# Patient Record
Sex: Male | Born: 1975 | Race: Black or African American | Hispanic: Refuse to answer | Marital: Married | Smoking: Current some day smoker
Health system: Southern US, Community
[De-identification: ages and names within clinical notes are randomized; demographics above are authoritative.]

## PROBLEM LIST (undated history)

## (undated) DIAGNOSIS — T7840XA Allergy, unspecified, initial encounter: Secondary | ICD-10-CM

## (undated) DIAGNOSIS — K219 Gastro-esophageal reflux disease without esophagitis: Secondary | ICD-10-CM

## (undated) HISTORY — DX: Gastro-esophageal reflux disease without esophagitis: K21.9

## (undated) HISTORY — DX: Allergy, unspecified, initial encounter: T78.40XA

## (undated) HISTORY — PX: WISDOM TOOTH EXTRACTION: SHX21

---

## 1997-12-29 ENCOUNTER — Emergency Department (HOSPITAL_COMMUNITY): Admission: EM | Admit: 1997-12-29 | Discharge: 1997-12-29 | Payer: Self-pay | Admitting: Emergency Medicine

## 1997-12-30 ENCOUNTER — Encounter: Payer: Self-pay | Admitting: Emergency Medicine

## 1999-05-23 ENCOUNTER — Encounter: Payer: Self-pay | Admitting: Emergency Medicine

## 1999-05-23 ENCOUNTER — Emergency Department (HOSPITAL_COMMUNITY): Admission: EM | Admit: 1999-05-23 | Discharge: 1999-05-23 | Payer: Self-pay | Admitting: *Deleted

## 2001-01-21 ENCOUNTER — Emergency Department (HOSPITAL_COMMUNITY): Admission: EM | Admit: 2001-01-21 | Discharge: 2001-01-21 | Payer: Self-pay | Admitting: Emergency Medicine

## 2001-01-21 ENCOUNTER — Encounter: Payer: Self-pay | Admitting: Emergency Medicine

## 2008-05-01 ENCOUNTER — Emergency Department (HOSPITAL_COMMUNITY): Admission: EM | Admit: 2008-05-01 | Discharge: 2008-05-01 | Payer: Self-pay | Admitting: Emergency Medicine

## 2010-04-19 LAB — COMPREHENSIVE METABOLIC PANEL
CO2: 33 mEq/L — ABNORMAL HIGH (ref 19–32)
Calcium: 9.8 mg/dL (ref 8.4–10.5)
Creatinine, Ser: 0.86 mg/dL (ref 0.4–1.5)
GFR calc Af Amer: >60 mL/min (ref >60)
GFR calc non Af Amer: >60 mL/min (ref >60)
Glucose, Bld: 113 mg/dL — ABNORMAL HIGH (ref 70–99)
Total Protein: 6.9 g/dL (ref 6.0–8.3)

## 2010-04-19 LAB — CBC
HCT: 46.1 % (ref 39.0–52.0)
Hemoglobin: 15.4 g/dL (ref 13.0–17.0)
MCHC: 33.4 g/dL (ref 30.0–36.0)
MCV: 83.7 fL (ref 78.0–100.0)
Platelets: 312 10*3/uL (ref 150–400)
RBC: 5.51 MIL/uL (ref 4.22–5.81)
RDW: 13.6 % (ref 11.5–15.5)
WBC: 7.4 10*3/uL (ref 4.0–10.5)

## 2010-04-19 LAB — DIFFERENTIAL
Lymphocytes Relative: 15 % (ref 12–46)
Lymphs Abs: 1.1 10*3/uL (ref 0.7–4.0)
Neutrophils Relative %: 76 % (ref 43–77)

## 2010-04-19 LAB — URINALYSIS, ROUTINE W REFLEX MICROSCOPIC
Bilirubin Urine: NEGATIVE
Glucose, UA: NEGATIVE mg/dL
Hgb urine dipstick: NEGATIVE
Ketones, ur: NEGATIVE mg/dL
Nitrite: NEGATIVE
Protein, ur: NEGATIVE mg/dL
Specific Gravity, Urine: 1.009 (ref 1.005–1.030)
Urobilinogen, UA: 0.2 mg/dL (ref 0.0–1.0)
pH: 7.5 (ref 5.0–8.0)

## 2010-04-19 LAB — LIPASE, BLOOD: Lipase: 15 U/L (ref 11–59)

## 2010-05-26 NOTE — Consult Note (Signed)
[facility]  Patient:    Douglas Klein, Douglas Klein Visit Number: [id number] MRN: [id number]          Service Type: EMS Location: [facility] Attending Physician:  Osvaldo Human Dictated by:   Marlan Palau, M.D. Proc. Date: 01/21/01 Admit Date:  01/21/2001 Discharge Date: 01/21/2001   CC:         [facility] [address]                          Consultation Report  HISTORY OF PRESENT ILLNESS:  The patient is a 35 year old, right-handed, black gentleman born 1975/07/06, with a history of intermittent dizzy episodes. The patient had a recurring dizzy event that occurred around 5:30 a.m. today lasting about 2 hours with resolution. The patient describes a light-headed, floaty feeling, not true vertigo. The patient did note some visual blurring and some slight tendency to go to one side or the other with walking but did not black out. The patient initially did not have a headache but as the dizziness cleared, had a mild bifrontal headache. The patient has had similar dizzy episodes in the past, usually not as severe as the one today. The patient was here in 1999 with a similar episode; claims to have minor dizzy episodes once every 2 weeks or so. Headaches may also occur with some regularity. The patient denies any photophobia, phonophobia, nausea, or vomiting with the headaches. 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 It is not clear whether this may be a bit larger than the prior scan done in 1999. Neurology was asked to see this patient for further evaluation. The patient at this point is feeling back to his baseline.  PAST MEDICAL HISTORY:  Significant for: 1. History of dizziness as above. 2. Rathkes pouch cyst as above. 3. History of headache, rule out migraine.  PAST SURGICAL HISTORY:  The patient states he has not had  any surgery.  CURRENT MEDICATIONS:  The patient is on no medications.  SOCIAL HABITS:  The patient does not smoke or drink; denies use of drugs.  ALLERGIES:  No known drug allergies.  SOCIAL HISTORY:  The patient lives in the [place] area; is a sorter for [employer], and the patient is married and has 2 children who are alive and well.  FAMILY MEDICAL HISTORY:  Notable that mother is alive with hypertension, some history of headache as well; father is alive and well. The patient has 1 brother and 1 sister, both alive and well.  REVIEW OF SYSTEMS:  Notable for fact that the patient has no fevers, chills, does note some slight neck stiffness, denies shortness of breath, chest pains, blackout episodes, nausea, vomiting, bowel or bladder control problems.  PHYSICAL EXAMINATION:  VITAL SIGNS:  Blood pressure 120/77, heart rate 67, respiratory rate 20, temperature afebrile.  GENERAL:  This patient is a well-developed black gentleman. He is alert and cooperative at the time of examination.  HEENT:  Head is atraumatic. Eyes:  Pupils are equal, round, and reactive to light. Discs are flat bilaterally.  NECK:  Supple. No carotid bruits noted.  RESPIRATORY:  Clear to auscultation and percussion.  CARDIOVASCULAR:  Reveals regular rate and rhythm without obvious murmurs or rubs noted.  EXTREMITIES:  Without significant edema.  NEUROLOGIC:  Cranial  nerves as above. Facial symmetry is present. The patient has good sensation in the face to pinprick and soft touch bilaterally. The patient has good strength in facial muscles and the muscles of the head turning, shoulder shrug bilaterally. The patient again has well-enunciated speech; no aphasia. Motor testing is normal on all fours. The patient has good finger-to-nose, finger-to-heel-to-shin. Gait normal. Tandem gait normal. Romberg negative. No evidence of pronator drift seen. Pinprick, soft touch, vibratory sensation is symmetric and normal  throughout. Again, deep tendon reflexes are symmetric and normal toes downgoing bilaterally.  LABORATORY AND ACCESSORY DATA:  Notable for white count 3.0, hemoglobin 15.1, hematocrit 44.8, MCV 78.6, platelets 311. Sodium 138, potassium 3.9, chloride 105, CO2 28, glucose 94, BUN 10, creatinine 0.9, calcium 9.4, total protein 7.5, albumin 4.1, AST 29, ALT 34, alkaline phosphatase 64, total bilirubin 1.0. Alcohol level negative.  Urinalysis:  Specific gravity 1.011, pH 7.5, otherwise unremarkable. Drug screen negative.  IMPRESSION: 1. History of dizziness, episode today. Etiology unclear. Rule out    associated migraine. 2. History of Rathkes pouch type cyst.  The patient has no brain stem compression from the above cyst. I am not clear this has any bearing on his current symptomatology. It is possible that dizziness may be related to migraine syndrome. The patient does have positive family history for migraine. We will release this patient to return home today and will follow with [facility] within about 3 or 4 weeks. May need to check pituitary access at that point with thyroid profile, growth hormone, Prolixin levels. The patient has apparently seen an endocrinologist in the past. Dictated by:   Marlan Palau, M.D. Attending Physician:  Osvaldo Human DD:  01/21/01 TD:  01/22/01 Job: [phone]

## 2012-06-12 ENCOUNTER — Encounter: Payer: Self-pay | Admitting: Family Medicine

## 2012-06-12 ENCOUNTER — Ambulatory Visit (INDEPENDENT_AMBULATORY_CARE_PROVIDER_SITE_OTHER): Payer: Self-pay | Admitting: Family Medicine

## 2012-06-12 VITALS — BP 120/70 | HR 76 | Temp 98.1°F | Resp 18 | Ht 67.25 in | Wt 173.0 lb

## 2012-06-12 DIAGNOSIS — Z0000 Encounter for general adult medical examination without abnormal findings: Secondary | ICD-10-CM

## 2012-06-12 DIAGNOSIS — Z23 Encounter for immunization: Secondary | ICD-10-CM

## 2012-06-12 LAB — LIPID PANEL
HDL: 52.6 mg/dL (ref >39.00)
LDL Cholesterol: 92 mg/dL (ref 0–99)
Total CHOL/HDL Ratio: 3
Triglycerides: 90 mg/dL (ref 0.0–149.0)
VLDL: 18 mg/dL (ref 0.0–40.0)

## 2012-06-12 LAB — CBC WITH DIFFERENTIAL/PLATELET
Basophils Absolute: 0 10*3/uL (ref 0.0–0.1)
HCT: 47.4 % (ref 39.0–52.0)
Lymphocytes Relative: 25 % (ref 12.0–46.0)
Lymphs Abs: 1.3 10*3/uL (ref 0.7–4.0)
Monocytes Relative: 9.3 % (ref 3.0–12.0)
Platelets: 368 10*3/uL (ref 150.0–400.0)
RDW: 14.6 % (ref 11.5–14.6)

## 2012-06-12 LAB — HEPATIC FUNCTION PANEL
Albumin: 4.4 g/dL (ref 3.5–5.2)
Alkaline Phosphatase: 50 U/L (ref 39–117)

## 2012-06-12 LAB — BASIC METABOLIC PANEL
CO2: 34 mEq/L — ABNORMAL HIGH (ref 19–32)
Chloride: 104 mEq/L (ref 96–112)
Potassium: 4.3 mEq/L (ref 3.5–5.1)
Sodium: 141 mEq/L (ref 135–145)

## 2012-06-12 LAB — TSH: TSH: 0.61 u[IU]/mL (ref 0.35–5.50)

## 2012-06-12 NOTE — Progress Notes (Signed)
  Subjective:    Patient ID: Douglas Klein, male    DOB: Aug 09, 1975, 37 y.o.   MRN: [id number]  HPI Patient here to establish care and for wellness visit No chronic medical problems. No prior surgeries. Takes no medications. Patient does not exercise consistently but job requires significant physical activity Patient nonsmoker. No alcohol use. Last tetanus unknown  Family history significant for father with alcohol history. Both parents with hypertension. Couple grandparents with history of CAD  Patient is married and has 3 children. 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   Review of Systems  Constitutional: Negative for fever, activity change, appetite change, fatigue and unexpected weight change.  HENT: Negative for ear pain, congestion and trouble swallowing.   Eyes: Negative for pain and visual disturbance.  Respiratory: Negative for cough, shortness of breath and wheezing.   Cardiovascular: Negative for chest pain and palpitations.  Gastrointestinal: Negative for nausea, vomiting, abdominal pain, diarrhea, constipation, blood in stool, abdominal distention and rectal pain.  Genitourinary: Negative for dysuria, hematuria and testicular pain.  Musculoskeletal: Negative for joint swelling and arthralgias.  Skin: Negative for rash.  Neurological: Negative for dizziness, syncope and headaches.  Hematological: Negative for adenopathy.  Psychiatric/Behavioral: Negative for confusion and dysphoric mood.       Objective:   Physical Exam  Constitutional: He is oriented to person, place, and time. He appears well-developed and well-nourished. No distress.  HENT:  Head: Normocephalic and atraumatic.  Right Ear: External ear normal.  Left Ear: External ear normal.  Mouth/Throat: Oropharynx is clear and moist.  Eyes: Conjunctivae and EOM are normal. Pupils are equal, round, and reactive to light.  Neck: Normal range of motion. Neck supple. No thyromegaly present.   Cardiovascular: Normal rate, regular rhythm and normal heart sounds.   No murmur heard. Pulmonary/Chest: No respiratory distress. He has no wheezes. He has no rales.  Abdominal: Soft. Bowel sounds are normal. He exhibits no distension and no mass. There is no tenderness. There is no rebound and no guarding.  Musculoskeletal: He exhibits no edema.  Lymphadenopathy:    He has no cervical adenopathy.  Neurological: He is alert and oriented to person, place, and time. He displays normal reflexes. No cranial nerve deficit.  Skin: No rash noted.  Psychiatric: He has a normal mood and affect.          Assessment & Plan:  Healthy 37 year old male. Obtain baseline labs. Tetanus booster given.

## 2012-06-12 NOTE — Patient Instructions (Addendum)
Try to establish regular exercise habits.

## 2013-05-07 ENCOUNTER — Ambulatory Visit (INDEPENDENT_AMBULATORY_CARE_PROVIDER_SITE_OTHER): Payer: BC Managed Care – PPO | Admitting: Family Medicine

## 2013-05-07 ENCOUNTER — Encounter: Payer: Self-pay | Admitting: Family Medicine

## 2013-05-07 VITALS — BP 122/78 | HR 84 | Temp 97.9°F | Wt 178.0 lb

## 2013-05-07 DIAGNOSIS — J329 Chronic sinusitis, unspecified: Secondary | ICD-10-CM

## 2013-05-07 MED ORDER — AMOXICILLIN 875 MG PO TABS: 875.0000 mg | ORAL_TABLET | Freq: Two times a day (BID) | ORAL | Status: DC

## 2013-05-07 NOTE — Progress Notes (Signed)
   Subjective:    Patient ID: Douglas Klein, male    DOB: 07-Jul-1975, 38 y.o.   MRN: [id number]  Sinusitis Associated symptoms include congestion, coughing, headaches and sinus pressure. Pertinent negatives include no chills or sore throat.   Acute visit. Patient seen with over one week history of progressive sinus symptoms. He has allergy symptoms generally this time year but does develop some headaches frontal sinus pressure burning sensation in the sinuses. No fevers or chills. Taking Zyrtec-D without much relief. Denies any sore throat. He describes thick yellow to green nasal discharge intermittently. Increased malaise. Dry cough.  No past medical history on file. No past surgical history on file.  reports that he has never smoked. He has never used smokeless tobacco. He reports that he drinks alcohol. He reports that he does not use illicit drugs. family history is not on file. No Known Allergies    Review of Systems  Constitutional: Positive for fatigue. Negative for fever and chills.  HENT: Positive for congestion and sinus pressure. Negative for sore throat.   Respiratory: Positive for cough.   Neurological: Positive for headaches.       Objective:   Physical Exam  Constitutional: He appears well-developed and well-nourished.  HENT:  Right Ear: External ear normal.  Left Ear: External ear normal.  Mouth/Throat: Oropharynx is clear and moist.  Neck: Neck supple.  Cardiovascular: Normal rate and regular rhythm.   Pulmonary/Chest: Effort normal and breath sounds normal. No respiratory distress. He has no wheezes.  Lymphadenopathy:    He has no cervical adenopathy.          Assessment & Plan:  Acute sinusitis. Suspect he also has some seasonal allergies. Add Nasacort to his Zyrtec. Given progression of symptoms we'll start amoxicillin 875 mg twice a day for 10 days

## 2013-05-07 NOTE — Patient Instructions (Signed)

## 2013-05-07 NOTE — Progress Notes (Signed)
Pre visit review using our clinic review tool, if applicable. No additional management support is needed unless otherwise documented below in the visit note. 

## 2013-11-16 ENCOUNTER — Emergency Department (HOSPITAL_COMMUNITY)
Admission: EM | Admit: 2013-11-16 | Discharge: 2013-11-16 | Disposition: A | Discharge status: Home / Self Care | Payer: BC Managed Care – PPO | Attending: Emergency Medicine | Admitting: Emergency Medicine

## 2013-11-16 ENCOUNTER — Encounter (HOSPITAL_COMMUNITY): Payer: Self-pay | Admitting: Emergency Medicine

## 2013-11-16 ENCOUNTER — Emergency Department (HOSPITAL_COMMUNITY): Payer: BC Managed Care – PPO

## 2013-11-16 DIAGNOSIS — R42 Dizziness and giddiness: Secondary | ICD-10-CM | POA: Diagnosis present

## 2013-11-16 DIAGNOSIS — Z792 Long term (current) use of antibiotics: Secondary | ICD-10-CM | POA: Diagnosis not present

## 2013-11-16 DIAGNOSIS — Z79899 Other long term (current) drug therapy: Secondary | ICD-10-CM | POA: Diagnosis not present

## 2013-11-16 DIAGNOSIS — R519 Headache, unspecified: Secondary | ICD-10-CM

## 2013-11-16 DIAGNOSIS — H8111 Benign paroxysmal vertigo, right ear: Secondary | ICD-10-CM

## 2013-11-16 DIAGNOSIS — G43909 Migraine, unspecified, not intractable, without status migrainosus: Secondary | ICD-10-CM | POA: Insufficient documentation

## 2013-11-16 DIAGNOSIS — R51 Headache: Secondary | ICD-10-CM

## 2013-11-16 LAB — CBC
HCT: 44.2 % (ref 39.0–52.0)
Hemoglobin: 15 g/dL (ref 13.0–17.0)
MCH: 27.5 pg (ref 26.0–34.0)
MCHC: 33.9 g/dL (ref 30.0–36.0)
MCV: 81 fL (ref 78.0–100.0)
PLATELETS: 278 10*3/uL (ref 150–400)
RBC: 5.46 MIL/uL (ref 4.22–5.81)
RDW: 13.1 % (ref 11.5–15.5)
WBC: 8 10*3/uL (ref 4.0–10.5)

## 2013-11-16 LAB — URINALYSIS, ROUTINE W REFLEX MICROSCOPIC
BILIRUBIN URINE: NEGATIVE
Glucose, UA: NEGATIVE mg/dL
Hgb urine dipstick: NEGATIVE
KETONES UR: NEGATIVE mg/dL
LEUKOCYTES UA: NEGATIVE
NITRITE: NEGATIVE
PROTEIN: NEGATIVE mg/dL
Specific Gravity, Urine: 1.018 (ref 1.005–1.030)
UROBILINOGEN UA: 1 mg/dL (ref 0.0–1.0)
pH: 7 (ref 5.0–8.0)

## 2013-11-16 LAB — BASIC METABOLIC PANEL
ANION GAP: 11 (ref 5–15)
BUN: 16 mg/dL (ref 6–23)
CALCIUM: 10 mg/dL (ref 8.4–10.5)
CO2: 29 mEq/L (ref 19–32)
CREATININE: 1.08 mg/dL (ref 0.50–1.35)
Chloride: 99 mEq/L (ref 96–112)
GFR calc non Af Amer: 85 mL/min — ABNORMAL LOW (ref >90)
Glucose, Bld: 152 mg/dL — ABNORMAL HIGH (ref 70–99)
Potassium: 4.2 mEq/L (ref 3.7–5.3)
SODIUM: 139 meq/L (ref 137–147)

## 2013-11-16 MED ORDER — MECLIZINE HCL 25 MG PO TABS
25.0000 mg | ORAL_TABLET | Freq: Once | ORAL | Status: AC
  Administered 2013-11-16: 25 mg via ORAL
  Filled 2013-11-16: qty 1

## 2013-11-16 MED ORDER — MECLIZINE HCL 12.5 MG PO TABS: 12.5000 mg | ORAL_TABLET | Freq: Three times a day (TID) | ORAL | Status: DC | PRN

## 2013-11-16 MED ORDER — ONDANSETRON 4 MG PO TBDP
4.0000 mg | ORAL_TABLET | Freq: Once | ORAL | Status: AC
  Administered 2013-11-16: 4 mg via ORAL
  Filled 2013-11-16: qty 1

## 2013-11-16 NOTE — ED Notes (Signed)
Family at bedside, mother with pt. Pt up to bedside to use urinal at this time. States no other needs

## 2013-11-16 NOTE — ED Notes (Signed)
Per EMS, pt experienced sudden onset of dizziness, bilateral weakness. Pt lethargic since EMS arrived on scene

## 2013-11-16 NOTE — ED Provider Notes (Signed)
The patient is a 38 year old male, no past medical history who presents after experiencing acute onset of vertigo while he was working. He states this came on acutely, improved with several minutes of resting and becomes worsened when moving his head. On exam the patient has reproducible nystatin S and vertigo with head movement which is fatigable and no other focal neurologic findings on his exam which was otherwise totally normal. CT scan of his head was ordered prior to this exam, exam was reassuring for a peripheral source of vertigo, improved significantly with meclizine and Zofran, appear stable for discharge if CT scan normal.  Medical screening examination/treatment/procedure(s) were conducted as a shared visit with non-physician practitioner(s) and myself.  I personally evaluated the patient during the encounter.  Clinical Impression:   Final diagnoses:  Dizziness  Headache  BPPV (benign paroxysmal positional vertigo), right         Vida RollerBrian D Darric Plante, MD 11/17/13 [phone]

## 2013-11-16 NOTE — ED Notes (Signed)
Bed: WA23 Expected date:  Expected time:  Means of arrival:  Comments: EMS-weakness 

## 2013-11-16 NOTE — ED Provider Notes (Signed)
CSN: [id number]     Arrival date & time 11/16/13  1715 History   First MD Initiated Contact with Patient 11/16/13 1802     Chief Complaint  Patient presents with  . Dizziness   Douglas Klein is a 38 y.o. male with a history of vertigo and migraines who presents to the ED after an episode of dizziness while working today. Patient reports he was driving when he felt like it was spinning around him. He pulled over and noted that he felt his vision was out of focus and had a questionable breif syncopal episode. He reports feeling weak and lightheaded and is now complaining of a headache behind his bilateral eyes. He reports this feels different from his previous episode of vertigo. He also reports some frequency of urination. He denies recent illness, fevers, chills, nausea, vomiting, abdominal pain, dysuria, hematuria, sinus congestion, nasal congestion, numbness, tingling, loss of bowel or bladder control. He denies history of seizures.  (Consider location/radiation/quality/duration/timing/severity/associated sxs/prior Treatment) Patient is a 38 y.o. male presenting with dizziness. The history is provided by the patient.  Dizziness Quality:  Head spinning and lightheadedness Severity:  Moderate Onset quality:  Sudden Duration:  3 hours Timing:  Constant Progression:  Worsening Chronicity:  New Associated symptoms: headaches, nausea, vision changes and weakness   Associated symptoms: no blood in stool, no chest pain, no diarrhea, no hearing loss, no palpitations, no shortness of breath, no tinnitus and no vomiting     History reviewed. No pertinent past medical history. History reviewed. No pertinent past surgical history. Family History  Problem Relation Age of Onset  . Hypertension Mother   . Hypertension Father   . Diabetes Other    History  Substance Use Topics  . Smoking status: Never Smoker   . Smokeless tobacco: Never Used  . Alcohol Use: Yes     Comment: "Socially"     Review of Systems  Constitutional: Negative for fever and chills.  HENT: Negative for congestion, ear discharge, ear pain, hearing loss, mouth sores, postnasal drip, rhinorrhea, sinus pressure, sore throat, tinnitus, trouble swallowing and voice change.   Eyes: Negative for pain and visual disturbance.  Respiratory: Negative for cough, shortness of breath and wheezing.   Cardiovascular: Negative for chest pain, palpitations and leg swelling.  Gastrointestinal: Positive for nausea. Negative for vomiting, abdominal pain, diarrhea, constipation and blood in stool.  Genitourinary: Positive for frequency. Negative for dysuria, urgency, hematuria and decreased urine volume.  Musculoskeletal: Negative for myalgias, back pain and neck pain.  Skin: Negative for rash and wound.  Neurological: Positive for dizziness, weakness, light-headedness and headaches. Negative for seizures, facial asymmetry, speech difficulty and numbness.  Psychiatric/Behavioral: Negative for confusion. The patient is not nervous/anxious.   All other systems reviewed and are negative.     Allergies  Review of patient's allergies indicates no known allergies.  Home Medications   Prior to Admission medications   Medication Sig Start Date End Date Taking? Authorizing Provider  aspirin-acetaminophen-caffeine (EXCEDRIN MIGRAINE) [phone] MG per tablet Take 1 tablet by mouth every 6 (six) hours as needed for headache (headache).   Yes Historical Provider, MD  OVER THE COUNTER MEDICATION Take 1 packet by mouth daily. [facility] Multi Vit Packet   Yes Historical Provider, MD  amoxicillin (AMOXIL) 875 MG tablet Take 1 tablet (875 mg total) by mouth 2 (two) times daily. 05/07/13   Kristian CoveyBruce W Burchette, MD  ascorbic acid (VITAMIN C) 250 MG CHEW Chew 250 mg by mouth daily.  Historical Provider, MD  ibuprofen (ADVIL,MOTRIN) 200 MG tablet Take 600 mg by mouth every 6 (six) hours as needed for moderate pain.     Historical Provider, MD   meclizine (ANTIVERT) 12.5 MG tablet Take 1 tablet (12.5 mg total) by mouth 3 (three) times daily as needed for dizziness. 11/16/13   Einar GipWilliam Duncan Migel Hannis, PA   BP 123/76 mmHg  Pulse 64  Temp(Src) 98.2 F (36.8 C) (Oral)  Resp 15  SpO2 100% Physical Exam  Constitutional: He is oriented to person, place, and time. He appears well-developed and well-nourished. No distress.  HENT:  Head: Normocephalic and atraumatic.  Right Ear: External ear normal.  Left Ear: External ear normal.  Nose: Nose normal.  Mouth/Throat: Oropharynx is clear and moist. No oropharyngeal exudate.  Bilateral TMs intact without erythema but with some bilateral inner ear fluid noted.  Eyes: Conjunctivae are normal. Pupils are equal, round, and reactive to light. Right eye exhibits no discharge. Left eye exhibits no discharge.  EOMs intact bilaterally. At revaluation patient has right sided nystagmus with right gaze that induced dizziness.   Neck: Normal range of motion. Neck supple.  Cardiovascular: Normal rate, regular rhythm, normal heart sounds and intact distal pulses.  Exam reveals no gallop and no friction rub.   No murmur heard. Pulmonary/Chest: Effort normal and breath sounds normal. No respiratory distress. He has no wheezes. He has no rales.  Abdominal: Soft. There is no tenderness.  Musculoskeletal: He exhibits no edema.  Lymphadenopathy:    He has no cervical adenopathy.  Neurological: He is alert and oriented to person, place, and time. He has normal reflexes. He displays normal reflexes. No cranial nerve deficit. Coordination normal.  Cranial nerves II through XII intact. Strength 5 out of 5 in his bilateral upper and lower extremities. Finger-to-nose intact. Rapid alternating movements intact. After meclizine patient is able to ambulate assistance or difficulty.  Skin: Skin is warm and dry. No rash noted. He is not diaphoretic. No erythema. No pallor.  Psychiatric: He has a normal mood and affect. His  behavior is normal.  Nursing note and vitals reviewed.   ED Course  Procedures (including critical care time) Labs Review Labs Reviewed  BASIC METABOLIC PANEL - Abnormal; Notable for the following:    Glucose, Bld 152 (*)    GFR calc non Af Amer 85 (*)    All other components within normal limits  CBC  URINALYSIS, ROUTINE W REFLEX MICROSCOPIC    Imaging Review Ct Head Wo Contrast  11/16/2013   CLINICAL DATA:  Two week history of headache diffusely ; new onset dizziness  EXAM: CT HEAD WITHOUT CONTRAST  TECHNIQUE: Contiguous axial images were obtained from the base of the skull through the vertex without intravenous contrast.  COMPARISON:  Brain MRI August 19, 2006  FINDINGS: The ventricles are normal in size and configuration. There is no mass, hemorrhage, extra-axial fluid collection, or midline shift. Gray-white compartments are normal. No acute infarct apparent. Bony calvarium appears intact. The mastoid air cells are clear. There is rightward deviation of the nasal septum.  IMPRESSION: Rightward deviation of nasal septum. No intracranial mass, hemorrhage, or focal gray -white compartment lesions/acute appearing infarct.   Electronically Signed   By: Bretta BangWilliam  Woodruff M.D.   On: 11/16/2013 21:48     EKG Interpretation   Date/Time:  Monday November 16 2013 17:44:07 EST Ventricular Rate:  77 PR Interval:  158 QRS Duration: 87 QT Interval:  364 QTC Calculation: 412 R Axis:  49 Text Interpretation:  Sinus rhythm No old tracing to compare Confirmed by  [facility]  MD, ELLIOTT [phone]) on 11/16/2013 6:32:28 PM     Filed Vitals:   11/16/13 1730 11/16/13 1731 11/16/13 1937 11/16/13 2133  BP: 134/83  133/80 123/76  Pulse:   77 64  Temp:  98.2 F (36.8 C)    TempSrc:  Oral    Resp:   14 15  SpO2:   100% 100%    MDM   Meds given in ED:  Medications  ondansetron (ZOFRAN-ODT) disintegrating tablet 4 mg (4 mg Oral Given 11/16/13 1903)  meclizine (ANTIVERT) tablet 25 mg (25 mg Oral  Given 11/16/13 1903)    Discharge Medication List as of 11/16/2013  9:58 PM    START taking these medications   Details  meclizine (ANTIVERT) 12.5 MG tablet Take 1 tablet (12.5 mg total) by mouth 3 (three) times daily as needed for dizziness., Starting 11/16/2013, Until Discontinued, Print        Final diagnoses:  Dizziness  Headache  BPPV (benign paroxysmal positional vertigo), right   Navid D Effinger is a 38 y.o. male present to the ED for sudden onset of dizziness and weakness while driving associated possible brief syncopal episode and headache.patient's urinalysis, CBC and BMP are unremarkable. Is normal sinus rhythm on EKG. His CT was unremarkable. The patient's dizziness resolved with meclizine the ED. With his dizziness controlled. Able to induce dizziness and nystagmus on rightward gaze. The patient is able to ambulate without assistance or difficulty. He feels he is able to be discharged at this time. Patient given meclizine 12.5 mg as needed for dizziness. I advised to use caution while taking meclizine as it can cause drowsiness. Advised he cannot return to work until his symptoms have resolved. His mother will be taking him home tonight. I advised him to follow-up with his primary care physician this week for further evaluation. Advised the patient to return to the ED with new or worsening symptoms or new concerns. He verbalized understanding and agreement with plan. Patient was discussed with and evaluated by Dr. Eber Hong who agrees with assessment and plan.    Lawana Chambers, PA 11/16/13 2258  Vida Roller, MD 11/17/13 [phone]

## 2013-11-16 NOTE — Discharge Instructions (Signed)
Benign Positional Vertigo Vertigo means you feel like you or your surroundings are moving when they are not. Benign positional vertigo is the most common form of vertigo. Benign means that the cause of your condition is not serious. Benign positional vertigo is more common in older adults. CAUSES  Benign positional vertigo is the result of an upset in the labyrinth system. This is an area in the middle ear that helps control your balance. This may be caused by a viral infection, head injury, or repetitive motion. However, often no specific cause is found. SYMPTOMS  Symptoms of benign positional vertigo occur when you move your head or eyes in different directions. Some of the symptoms may include:  Loss of balance and falls.  Vomiting.  Blurred vision.  Dizziness.  Nausea.  Involuntary eye movements (nystagmus). DIAGNOSIS  Benign positional vertigo is usually diagnosed by physical exam. If the specific cause of your benign positional vertigo is unknown, your caregiver may perform imaging tests, such as magnetic resonance imaging (MRI) or computed tomography (CT). TREATMENT  Your caregiver may recommend movements or procedures to correct the benign positional vertigo. Medicines such as meclizine, benzodiazepines, and medicines for nausea may be used to treat your symptoms. In rare cases, if your symptoms are caused by certain conditions that affect the inner ear, you may need surgery. HOME CARE INSTRUCTIONS   Follow your caregiver's instructions.  Move slowly. Do not make sudden body or head movements.  Avoid driving.  Avoid operating heavy machinery.  Avoid performing any tasks that would be dangerous to you or others during a vertigo episode.  Drink enough fluids to keep your urine clear or pale yellow. SEEK IMMEDIATE MEDICAL CARE IF:   You develop problems with walking, weakness, numbness, or using your arms, hands, or legs.  You have difficulty speaking.  You develop  severe headaches.  Your nausea or vomiting continues or gets worse.  You develop visual changes.  Your family or friends notice any behavioral changes.  Your condition gets worse.  You have a fever.  You develop a stiff neck or sensitivity to light. MAKE SURE YOU:   Understand these instructions.  Will watch your condition.  Will get help right away if you are not doing well or get worse. Document Released: 10/02/2005 Document Revised: 03/19/2011 Document Reviewed: 09/14/2010 Exit[employer] Patient Information 2015 [employer]. This information is not intended to replace advice given to you by your health care provider. Make sure you discuss any questions you have with your health care provider.    

## 2013-11-19 ENCOUNTER — Encounter: Payer: Self-pay | Admitting: Family Medicine

## 2013-11-19 ENCOUNTER — Ambulatory Visit (INDEPENDENT_AMBULATORY_CARE_PROVIDER_SITE_OTHER): Payer: BC Managed Care – PPO | Admitting: Family Medicine

## 2013-11-19 VITALS — BP 130/80 | HR 77 | Temp 98.1°F | Wt 184.0 lb

## 2013-11-19 DIAGNOSIS — R42 Dizziness and giddiness: Secondary | ICD-10-CM

## 2013-11-19 NOTE — Patient Instructions (Signed)

## 2013-11-19 NOTE — Progress Notes (Signed)
   Subjective:    Patient ID: Douglas Klein, male    DOB: 29-Sep-1975, 38 y.o.   MRN: [id number]  HPI Patient seen for ER follow-up. On Monday he was at work and had onset fairly acutely of dizziness.  Had vertigo type dizziness by description. He also had some headache which was bifrontal headache. He was driving when he first had symptoms. He did not have any clear syncopal type symptoms. He was describing vertigo and he did not have any associated fevers, chills, nausea, vomiting, focal weakness, ataxia. This was a question of transient diplopia but none since then. Patient's symptoms were severe enough to be transported by EMS. He had CT head which showed no acute abnormalities. Lab work unremarkable. He was given meclizine and Zofran. his symptoms have pretty much fully resolved at this time. No headaches currently.  He did have some horizontal nystagmus per evaluation in ED.Marland Kitchen. No recent hearing changes.  ED notes and labs reviewed.  No past medical history on file. No past surgical history on file.  reports that he has never smoked. He has never used smokeless tobacco. He reports that he drinks alcohol. He reports that he does not use illicit drugs. family history includes Diabetes in his other; Hypertension in his father and mother. No Known Allergies    Review of Systems  Constitutional: Negative for fever and chills.  HENT: Negative for trouble swallowing.   Eyes: Negative for visual disturbance.  Respiratory: Negative for cough.   Cardiovascular: Negative for chest pain.  Neurological: Negative for seizures, syncope, weakness and headaches.  Hematological: Negative for adenopathy.       Objective:   Physical Exam  Constitutional: He is oriented to person, place, and time. He appears well-developed and well-nourished.  HENT:  Right Ear: External ear normal.  Left Ear: External ear normal.  Eyes: EOM are normal. Pupils are equal, round, and reactive to light.  Fundi benign    Neck: Neck supple.  Cardiovascular: Normal rate and regular rhythm.   No murmur heard. Pulmonary/Chest: Effort normal and breath sounds normal. No respiratory distress. He has no wheezes. He has no rales.  Neurological: He is alert and oriented to person, place, and time. No cranial nerve deficit. Coordination normal.          Assessment & Plan:  Recent vertigo. Resolved at this time. Suspect benign peripheral positional vertigo by history. We've instructed to follow-up promptly for any recurrent symptoms. Handout given.

## 2013-11-19 NOTE — Progress Notes (Signed)
Pre visit review using our clinic review tool, if applicable. No additional management support is needed unless otherwise documented below in the visit note. 

## 2013-11-25 ENCOUNTER — Ambulatory Visit: Payer: BC Managed Care – PPO | Admitting: Family Medicine

## 2013-11-26 ENCOUNTER — Ambulatory Visit: Payer: BC Managed Care – PPO | Admitting: Family Medicine

## 2014-09-30 ENCOUNTER — Encounter: Payer: Self-pay | Admitting: Adult Health

## 2014-09-30 ENCOUNTER — Ambulatory Visit (INDEPENDENT_AMBULATORY_CARE_PROVIDER_SITE_OTHER): Payer: BLUE CROSS/BLUE SHIELD | Admitting: Adult Health

## 2014-09-30 VITALS — BP 110/80 | HR 61 | Wt 187.6 lb

## 2014-09-30 DIAGNOSIS — T148 Other injury of unspecified body region: Secondary | ICD-10-CM

## 2014-09-30 DIAGNOSIS — T148XXA Other injury of unspecified body region, initial encounter: Secondary | ICD-10-CM

## 2014-09-30 MED ORDER — CYCLOBENZAPRINE HCL 10 MG PO TABS: 10.0000 mg | ORAL_TABLET | Freq: Three times a day (TID) | ORAL | Status: DC | PRN

## 2014-09-30 NOTE — Progress Notes (Signed)
Pre visit review using our clinic review tool, if applicable. No additional management support is needed unless otherwise documented below in the visit note. 

## 2014-09-30 NOTE — Patient Instructions (Addendum)
It was great meeting you today!  I have sent in a prescription for Flexeril. Take this tonight to see how it makes you feel. If you do not feel sleepy you can take it three times a day.   Ibuprofen  every 8 hours for the next three days.   Use a heating pad when you are able too.   Let me know if you are not feeling better in the next 2-3 days and we can consult sports med.    Muscle Strain A muscle strain is an injury that occurs when a muscle is stretched beyond its normal length. Usually a small number of muscle fibers are torn when this happens. Muscle strain is rated in degrees. First-degree strains have the least amount of muscle fiber tearing and pain. Second-degree and third-degree strains have increasingly more tearing and pain.  Usually, recovery from muscle strain takes 1-2 weeks. Complete healing takes 5-6 weeks.  CAUSES  Muscle strain happens when a sudden, violent force placed on a muscle stretches it too far. This may occur with lifting, sports, or a fall.  RISK FACTORS Muscle strain is especially common in athletes.  SIGNS AND SYMPTOMS At the site of the muscle strain, there may be:  Pain.  Bruising.  Swelling.  Difficulty using the muscle due to pain or lack of normal function. DIAGNOSIS  Your health care provider will perform a physical exam and ask about your medical history. TREATMENT  Often, the best treatment for a muscle strain is resting, icing, and applying cold compresses to the injured area.  HOME CARE INSTRUCTIONS   Use the PRICE method of treatment to promote muscle healing during the first 2-3 days after your injury. The PRICE method involves:  Protecting the muscle from being injured again.  Restricting your activity and resting the injured body part.  Icing your injury. To do this, put ice in a plastic bag. Place a towel between your skin and the bag. Then, apply the ice and leave it on from 15-20 minutes each hour. After the third day,  switch to moist heat packs.  Apply compression to the injured area with a splint or elastic bandage. Be careful not to wrap it too tightly. This may interfere with blood circulation or increase swelling.  Elevate the injured body part above the level of your heart as often as you can.  Only take over-the-counter or prescription medicines for pain, discomfort, or fever as directed by your health care provider.  Warming up prior to exercise helps to prevent future muscle strains. SEEK MEDICAL CARE IF:   You have increasing pain or swelling in the injured area.  You have numbness, tingling, or a significant loss of strength in the injured area. MAKE SURE YOU:   Understand these instructions.  Will watch your condition.  Will get help right away if you are not doing well or get worse. Document Released: 12/25/2004 Document Revised: 10/15/2012 Document Reviewed: 07/24/2012 [facility] Patient Information 2015 [place], [place]. This information is not intended to replace advice given to you by your health care provider. Make sure you discuss any questions you have with your health care provider.

## 2014-09-30 NOTE — Progress Notes (Signed)
Subjective:    Patient ID: Douglas Klein, male    DOB: 1975/12/19, 39 y.o.   MRN: [id number]  HPI  39 year old male who presents to the office today for right shoulder pain that he has had for "multiple months." Pain is described as " sharp numbing" pain. Pain is intermittent. 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 Pain is on outside of shoulder, across the cl  Pain usually starts in the morning and goes away as the day goes on. He is a side sleeper.  Denies any trauma to the area.  No bruising or inflammation.   Has been using a heating pad which he endorses helps with the pain.   Review of Systems  Respiratory: Negative.   Cardiovascular: Negative.   Musculoskeletal: Positive for myalgias. Negative for back pain, joint swelling, gait problem, neck pain and neck stiffness.  Skin: Negative.    No past medical history on file.  Social History   Social History  . Marital Status: Legally Separated    Spouse Name: N/A  . Number of Children: N/A  . Years of Education: N/A   Occupational History  . Not on file.   Social History Main Topics  . Smoking status: Never Smoker   . Smokeless tobacco: Never Used  . Alcohol Use: Yes     Comment: "Socially"  . Drug Use: No  . Sexual Activity: Not on file   Other Topics Concern  . Not on file   Social History Narrative    No past surgical history on file.  Family History  Problem Relation Age of Onset  . Hypertension Mother   . Hypertension Father   . Diabetes Other     No Known Allergies  Current Outpatient Prescriptions on File Prior to Visit  Medication Sig Dispense Refill  . ascorbic acid (VITAMIN C) 250 MG CHEW Chew 250 mg by mouth daily.    Marland Kitchen aspirin-acetaminophen-caffeine (EXCEDRIN MIGRAINE) 250-250-65 MG per tablet Take 1 tablet by mouth every 6 (six) hours as needed for headache (headache).    Marland Kitchen ibuprofen (ADVIL,MOTRIN) 200 MG tablet Take 600 mg by mouth every 6 (six) hours as needed for moderate  pain.     . meclizine (ANTIVERT) 12.5 MG tablet Take 1 tablet (12.5 mg total) by mouth 3 (three) times daily as needed for dizziness. (Patient not taking: Reported on 09/30/2014) 30 tablet 0  . OVER THE COUNTER MEDICATION Take 1 packet by mouth daily. [employer] Multi Vit Packet     No current facility-administered medications on file prior to visit.    BP 110/80 mmHg  Pulse 61  Wt 187 lb 9.6 oz (85.095 kg)  SpO2 98%       Objective:   Physical Exam  Constitutional: He is oriented to person, place, and time. He appears well-developed and well-nourished. No distress.  Cardiovascular: Normal rate, regular rhythm, normal heart sounds and intact distal pulses.  Exam reveals no gallop and no friction rub.   No murmur heard. Pulmonary/Chest: Effort normal and breath sounds normal. No respiratory distress. He has no wheezes. He has no rales. He exhibits no tenderness.  Musculoskeletal: Normal range of motion. He exhibits tenderness (Pain with palpation to upper deltoid. Has full ROM. ). He exhibits no edema.  Neurological: He is alert and oriented to person, place, and time.  Skin: Skin is warm and dry. No rash noted. He is not diaphoretic. No erythema. No pallor.  Psychiatric: He  has a normal mood and affect. His behavior is normal. Judgment and thought content normal.  Nursing note and vitals reviewed.      Assessment & Plan:  1. Muscle strain - Likely MSK in nature, possibly strain from sleeping on his side. Cannot r/o ligament. I am not concerned at this time for rotator cuff injury or fracture at this time. - cyclobenzaprine (FLEXERIL) 10 MG tablet; Take 1 tablet (10 mg total) by mouth 3 (three) times daily as needed for muscle spasms.  Dispense: 30 tablet; Refill: 0 - Ibuprofen  every 8 hours x 3 days.  - Heating pack - If no improvement then consider referral to sports medication

## 2015-01-09 ENCOUNTER — Emergency Department (HOSPITAL_COMMUNITY)
Admission: EM | Admit: 2015-01-09 | Discharge: 2015-01-09 | Disposition: A | Discharge status: Home / Self Care | Payer: BLUE CROSS/BLUE SHIELD | Attending: Emergency Medicine | Admitting: Emergency Medicine

## 2015-01-09 ENCOUNTER — Encounter (HOSPITAL_COMMUNITY): Payer: Self-pay | Admitting: Emergency Medicine

## 2015-01-09 DIAGNOSIS — F10920 Alcohol use, unspecified with intoxication, uncomplicated: Secondary | ICD-10-CM

## 2015-01-09 DIAGNOSIS — F10129 Alcohol abuse with intoxication, unspecified: Secondary | ICD-10-CM | POA: Diagnosis present

## 2015-01-09 DIAGNOSIS — F10120 Alcohol abuse with intoxication, uncomplicated: Secondary | ICD-10-CM | POA: Diagnosis not present

## 2015-01-09 MED ORDER — ONDANSETRON 8 MG PO TBDP
8.0000 mg | ORAL_TABLET | Freq: Once | ORAL | Status: AC
  Administered 2015-01-09: 8 mg via ORAL
  Filled 2015-01-09: qty 1

## 2015-01-09 NOTE — Discharge Instructions (Signed)
Alcohol Intoxication  Alcohol intoxication occurs when you drink enough alcohol that it affects your ability to function. It can be mild or very severe. Drinking a lot of alcohol in a short time is called binge drinking. This can be very harmful. Drinking alcohol can also be more dangerous if you are taking medicines or other drugs. Some of the effects caused by alcohol may include:  · Loss of coordination.  · Changes in mood and behavior.  · Unclear thinking.  · Trouble talking (slurred speech).  · Throwing up (vomiting).  · Confusion.  · Slowed breathing.  · Twitching and shaking (seizures).  · Loss of consciousness.  HOME CARE  · Do not drive after drinking alcohol.  · Drink enough water and fluids to keep your pee (urine) clear or pale yellow. Avoid caffeine.  · Only take medicine as told by your doctor.  GET HELP IF:  · You throw up (vomit) many times.  · You do not feel better after a few days.  · You frequently have alcohol intoxication. Your doctor can help decide if you should see a substance use treatment counselor.  GET HELP RIGHT AWAY IF:  · You become shaky when you stop drinking.  · You have twitching and shaking.  · You throw up blood. It may look bright red or like coffee grounds.  · You notice blood in your poop (bowel movements).  · You become lightheaded or pass out (faint).  MAKE SURE YOU:   · Understand these instructions.  · Will watch your condition.  · Will get help right away if you are not doing well or get worse.     This information is not intended to replace advice given to you by your health care provider. Make sure you discuss any questions you have with your health care provider.     Document Released: 06/13/2007 Document Revised: 08/27/2012 Document Reviewed: 05/30/2012  Elsevier Interactive Patient Education ©2016 [employer]      Alcohol Intoxication  Alcohol intoxication occurs when you drink enough alcohol that it affects your ability to function. It can be mild or very severe.  Drinking a lot of alcohol in a short time is called binge drinking. This can be very harmful. Drinking alcohol can also be more dangerous if you are taking medicines or other drugs. Some of the effects caused by alcohol may include:  · Loss of coordination.  · Changes in mood and behavior.  · Unclear thinking.  · Trouble talking (slurred speech).  · Throwing up (vomiting).  · Confusion.  · Slowed breathing.  · Twitching and shaking (seizures).  · Loss of consciousness.  HOME CARE  · Do not drive after drinking alcohol.  · Drink enough water and fluids to keep your pee (urine) clear or pale yellow. Avoid caffeine.  · Only take medicine as told by your doctor.  GET HELP IF:  · You throw up (vomit) many times.  · You do not feel better after a few days.  · You frequently have alcohol intoxication. Your doctor can help decide if you should see a substance use treatment counselor.  GET HELP RIGHT AWAY IF:  · You become shaky when you stop drinking.  · You have twitching and shaking.  · You throw up blood. It may look bright red or like coffee grounds.  · You notice blood in your poop (bowel movements).  · You become lightheaded or pass out (faint).  MAKE SURE YOU:   ·   Understand these instructions.  · Will watch your condition.  · Will get help right away if you are not doing well or get worse.     This information is not intended to replace advice given to you by your health care provider. Make sure you discuss any questions you have with your health care provider.     Document Released: 06/13/2007 Document Revised: 08/27/2012 Document Reviewed: 05/30/2012  Elsevier Interactive Patient Education ©2016 [employer]

## 2015-01-09 NOTE — ED Provider Notes (Signed)
CSN: [id number]     Arrival date & time 01/09/15  0244 History   First MD Initiated Contact with Patient 01/09/15 [phone]     Chief Complaint  Patient presents with  . Alcohol Intoxication     (Consider location/radiation/quality/duration/timing/severity/associated sxs/prior Treatment) Patient is a 40 y.o. male presenting with intoxication. The history is provided by the EMS personnel and the patient. History limited by: refusing to speak with provider.  Alcohol Intoxication Episode onset: unknown. The problem occurs constantly. The problem has not changed since onset.Nothing aggravates the symptoms. Nothing relieves the symptoms. He has tried nothing for the symptoms. The treatment provided no relief.    History reviewed. No pertinent past medical history. History reviewed. No pertinent past surgical history. Family History  Problem Relation Age of Onset  . Hypertension Mother   . Hypertension Father   . Diabetes Other    Social History  Substance Use Topics  . Smoking status: Never Smoker   . Smokeless tobacco: Never Used  . Alcohol Use: Yes     Comment: "Socially"    Review of Systems  All other systems reviewed and are negative.     Allergies  Review of patient's allergies indicates no known allergies.  Home Medications   Prior to Admission medications   Medication Sig Start Date End Date Taking? Authorizing Provider  ibuprofen (ADVIL,MOTRIN) 200 MG tablet Take 800 mg by mouth every 6 (six) hours as needed for moderate pain.    Yes Historical Provider, MD  cyclobenzaprine (FLEXERIL) 10 MG tablet Take 1 tablet (10 mg total) by mouth 3 (three) times daily as needed for muscle spasms. Patient not taking: Reported on 01/09/2015 09/30/14   Shirline Freesory Nafziger, NP   There were no vitals taken for this visit. Physical Exam  Constitutional: He appears well-developed and well-nourished. No distress.  Sleeping and awake easily to verbal stimuli  HENT:  Head: Normocephalic and  atraumatic.  Right Ear: External ear normal.  Left Ear: External ear normal.  Eyes: Conjunctivae are normal.  Neck: Normal range of motion. Neck supple.  Cardiovascular: Normal rate, regular rhythm and intact distal pulses.   Pulmonary/Chest: Effort normal. No respiratory distress. He has no wheezes. He has no rales.  Abdominal: Soft. Bowel sounds are normal. There is no tenderness. There is no rebound and no guarding.  Musculoskeletal: Normal range of motion. He exhibits no edema or tenderness.  Neurological: He is alert. He has normal reflexes.  Skin: Skin is warm and dry.  Psychiatric: His speech is not rapid and/or pressured.    ED Course  Procedures (including critical care time) Labs Review Labs Reviewed - No data to display  Imaging Review No results found. I have personally reviewed and evaluated these images and lab results as part of my medical decision-making.   EKG Interpretation None      MDM   Final diagnoses:  None    No emesis.  Po challenged in the department.  No signs of trauma.  Normal BP and pulse rate.  There is no indication for IVF in this patient.  Stable for discharge    Locklan Canoy, MD 01/09/15 [phone]

## 2015-01-09 NOTE — ED Notes (Signed)
Per [facility], pt picked up for ETOH intoxication. Was cooperative and answered questions and requested transport to hospital for IV fluids.

## 2015-07-25 ENCOUNTER — Encounter: Payer: Self-pay | Admitting: Family Medicine

## 2015-07-25 ENCOUNTER — Telehealth: Payer: Self-pay | Admitting: Family Medicine

## 2015-07-25 ENCOUNTER — Ambulatory Visit (INDEPENDENT_AMBULATORY_CARE_PROVIDER_SITE_OTHER): Payer: BLUE CROSS/BLUE SHIELD | Admitting: Family Medicine

## 2015-07-25 VITALS — BP 112/86 | HR 76 | Temp 98.0°F | Ht 67.5 in | Wt 185.3 lb

## 2015-07-25 DIAGNOSIS — R5383 Other fatigue: Secondary | ICD-10-CM | POA: Diagnosis not present

## 2015-07-25 DIAGNOSIS — R42 Dizziness and giddiness: Secondary | ICD-10-CM

## 2015-07-25 LAB — BASIC METABOLIC PANEL
BUN: 11 mg/dL (ref 6–23)
CHLORIDE: 100 meq/L (ref 96–112)
CO2: 32 mEq/L (ref 19–32)
Calcium: 9.9 mg/dL (ref 8.4–10.5)
Creatinine, Ser: 1.08 mg/dL (ref 0.40–1.50)
GFR: 97.02 mL/min (ref >60.00)
Glucose, Bld: 95 mg/dL (ref 70–99)
POTASSIUM: 4.3 meq/L (ref 3.5–5.1)
SODIUM: 138 meq/L (ref 135–145)

## 2015-07-25 MED ORDER — MECLIZINE HCL 25 MG PO TABS: 25.0000 mg | ORAL_TABLET | Freq: Three times a day (TID) | ORAL | Status: DC | PRN

## 2015-07-25 MED ORDER — MECLIZINE HCL 32 MG PO TABS
32.0000 mg | ORAL_TABLET | Freq: Three times a day (TID) | ORAL | Status: DC | PRN
Start: 2015-07-25 — End: 2015-07-25

## 2015-07-25 NOTE — Progress Notes (Signed)
Subjective:    Patient ID: Douglas Klein, male    DOB: December 29, 1975, 40 y.o.   MRN: [id number]  HPI  Mr. Yetta BarreJones is a 40 year old male who presents today with dizziness that has been present intermittently for 2 weeks. Episodes of vertigo have occurred intermittently which resolve spontaneously per patient report. Associated symptom of fatigue is noted. He denies fever, chills, sweats, N/V/D, hearing loss,  numbness, tingling, syncope, history of seizures, abdominal pain, dysuria, hematuria, sinus pressure/pain, nasal congestion, numbness, tingling, loss of bowel or bladder control. History of vertigo and migraines that have been related to caffeine use per patient. He also had some horizontal nystagmus previously when he was evaluated for vertigo. Patient reports stopping caffeine intake that resolved symptoms in the past.  He also reports seasonal allergies which he treats on an intermittent basis. No treatments for dizziness have been tried at home at this time.   Previous history of vertigo in 11/2013 which was suspected to be benign peripheral positional vertigo that resolved.  Review of Systems  Constitutional: Positive for fatigue. Negative for fever and chills.  HENT: Negative for congestion, postnasal drip, rhinorrhea, sinus pressure, sneezing and sore throat.   Eyes: Negative for visual disturbance.  Respiratory: Negative for cough, shortness of breath and wheezing.   Cardiovascular: Negative for chest pain, palpitations and leg swelling.  Gastrointestinal: Negative for nausea, vomiting, abdominal pain, diarrhea, constipation and blood in stool.  Endocrine: Negative for polydipsia, polyphagia and polyuria.  Genitourinary: Negative for dysuria, urgency, frequency, hematuria and flank pain.  Musculoskeletal: Negative for myalgias and back pain.  Skin: Negative for pallor.  Neurological: Positive for dizziness. Negative for seizures, syncope, weakness, light-headedness, numbness and  headaches.    No past medical history on file.   Social History   Social History  . Marital Status: Legally Separated    Spouse Name: N/A  . Number of Children: N/A  . Years of Education: N/A   Occupational History  . Not on file.   Social History Main Topics  . Smoking status: Never Smoker   . Smokeless tobacco: Never Used  . Alcohol Use: Yes     Comment: "Socially"  . Drug Use: No  . Sexual Activity: Not on file   Other Topics Concern  . Not on file   Social History Narrative    No past surgical history on file.  Family History  Problem Relation Age of Onset  . Hypertension Mother   . Hypertension Father   . Diabetes Other     No Known Allergies  Current Outpatient Prescriptions on File Prior to Visit  Medication Sig Dispense Refill  . cyclobenzaprine (FLEXERIL) 10 MG tablet Take 1 tablet (10 mg total) by mouth 3 (three) times daily as needed for muscle spasms. (Patient not taking: Reported on 01/09/2015) 30 tablet 0  . ibuprofen (ADVIL,MOTRIN) 200 MG tablet Take 800 mg by mouth every 6 (six) hours as needed for moderate pain. Reported on 07/25/2015     No current facility-administered medications on file prior to visit.    BP 112/86 mmHg  Pulse 76  Temp(Src) 98 F (36.7 C) (Oral)  Ht 5' 7.5" (1.715 m)  Wt 185 lb 5 oz (84.057 kg)  BMI 28.58 kg/m2  SpO2 99%     Objective:   Physical Exam  Constitutional: He is oriented to person, place, and time. He appears well-developed and well-nourished.  Eyes: Pupils are equal, round, and reactive to light. No scleral icterus.  Neck:  Neck supple.  Cardiovascular: Normal rate, regular rhythm and intact distal pulses.   Pulmonary/Chest: Effort normal and breath sounds normal. He has no wheezes. He has no rales.  Lymphadenopathy:    He has no cervical adenopathy.  Neurological: He is alert and oriented to person, place, and time.  II-Visual fields grossly intact. III/IV/VI-Extraocular movements intact. Pupils  reactive bilaterally. V/VII-Smile symmetric, equal eyebrow raise, facial sensation intact VIII- Hearing grossly intact XI-bilateral shoulder shrug XII-midline tongue extension Motor: 5/5 bilaterally with normal tone and bulk Cerebellar: Normal finger-to-nose   Romberg negative Ambulates with a coordinated gait No nystagmus noted      Assessment & Plan:  1. Dizziness and giddiness Exam and history are reassuring. Suspect that symptom of dizziness is related to benign peripheral positional vertigo based upon history and exam. Meclizine for symptoms and advised patient to follow up with PCP if symptoms do not improve, worsen, or he develops new symptoms. Patient agreed with plan.  2. Other fatigue Exam and history are reassuring. 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 Will check CBC and BMP.  - CBC with Differential/Platelet - Basic metabolic panel  Advised patient to follow up for recommended preventive care with his PCP.  Roddie Mc, FNP-C

## 2015-07-25 NOTE — Telephone Encounter (Signed)
New rx sent to pharmacy

## 2015-07-25 NOTE — Patient Instructions (Signed)
We have ordered labs or studies at this visit. It can take up to 1-2 weeks for results and processing. IF results require follow up or explanation, we will call you with instructions. Clinically stable results will be released to your [facility]. If you have not heard from us or cannot find your results in [facility] in 2 weeks please contact our office at [phone].  If you are not yet signed up for [facility], please consider signing up  Please follow up in 1 to 2 weeks.  Dizziness Dizziness is a common problem. It is a feeling of unsteadiness or light-headedness. You may feel like you are about to faint. Dizziness can lead to injury if you stumble or fall. Anyone can become dizzy, but dizziness is more common in older adults. This condition can be caused by a number of things, including medicines, dehydration, or illness. HOME CARE INSTRUCTIONS Taking these steps may help with your condition: Eating and Drinking  Drink enough fluid to keep your urine clear or pale yellow. This helps to keep you from becoming dehydrated. Try to drink more clear fluids, such as water.  Do not drink alcohol.  Limit your caffeine intake if directed by your health care provider.  Limit your salt intake if directed by your health care provider. Activity  Avoid making quick movements.  Rise slowly from chairs and steady yourself until you feel okay.  In the morning, first sit up on the side of the bed. When you feel okay, stand slowly while you hold onto something until you know that your balance is fine.  Move your legs often if you need to stand in one place for a long time. Tighten and relax your muscles in your legs while you are standing.  Do not drive or operate heavy machinery if you feel dizzy.  Avoid bending down if you feel dizzy. Place items in your home so that they are easy for you to reach without leaning over. Lifestyle  Do not use any tobacco products, including cigarettes, chewing tobacco, or  electronic cigarettes. If you need help quitting, ask your health care provider.  Try to reduce your stress level, such as with yoga or meditation. Talk with your health care provider if you need help. General Instructions  Watch your dizziness for any changes.  Take medicines only as directed by your health care provider. Talk with your health care provider if you think that your dizziness is caused by a medicine that you are taking.  Tell a friend or a family member that you are feeling dizzy. If he or she notices any changes in your behavior, have this person call your health care provider.  Keep all follow-up visits as directed by your health care provider. This is important. SEEK MEDICAL CARE IF:  Your dizziness does not go away.  Your dizziness or light-headedness gets worse.  You feel nauseous.  You have reduced hearing.  You have new symptoms.  You are unsteady on your feet or you feel like the room is spinning. SEEK IMMEDIATE MEDICAL CARE IF:  You vomit or have diarrhea and are unable to eat or drink anything.  You have problems talking, walking, swallowing, or using your arms, hands, or legs.  You feel generally weak.  You are not thinking clearly or you have trouble forming sentences. It may take a friend or family member to notice this.  You have chest pain, abdominal pain, shortness of breath, or sweating.  Your vision changes.  You notice  any bleeding.  You have a headache.  You have neck pain or a stiff neck.  You have a fever.   This information is not intended to replace advice given to you by your health care provider. Make sure you discuss any questions you have with your health care provider.   Document Released: 06/20/2000 Document Revised: 05/11/2014 Document Reviewed: 12/21/2013 Elsevier Interactive Patient Education [employer].

## 2015-07-25 NOTE — Progress Notes (Signed)
Pre visit review using our clinic review tool, if applicable. No additional management support is needed unless otherwise documented below in the visit note. 

## 2015-07-25 NOTE — Telephone Encounter (Signed)
Pt was seen by julie today and pharm did not received the meclizine rx [facility] [address]

## 2015-07-26 LAB — CBC WITH DIFFERENTIAL/PLATELET
BASOS ABS: 0 10*3/uL (ref 0.0–0.1)
Basophils Relative: 0.8 % (ref 0.0–3.0)
EOS ABS: 0.1 10*3/uL (ref 0.0–0.7)
Eosinophils Relative: 2.6 % (ref 0.0–5.0)
HCT: 45.6 % (ref 39.0–52.0)
Hemoglobin: 15.4 g/dL (ref 13.0–17.0)
LYMPHS ABS: 1.7 10*3/uL (ref 0.7–4.0)
Lymphocytes Relative: 33 % (ref 12.0–46.0)
MCHC: 33.9 g/dL (ref 30.0–36.0)
MCV: 80.9 fl (ref 78.0–100.0)
Monocytes Absolute: 0.4 10*3/uL (ref 0.1–1.0)
Monocytes Relative: 7.2 % (ref 3.0–12.0)
NEUTROS ABS: 3 10*3/uL (ref 1.4–7.7)
NEUTROS PCT: 56.4 % (ref 43.0–77.0)
PLATELETS: 331 10*3/uL (ref 150.0–400.0)
RBC: 5.63 Mil/uL (ref 4.22–5.81)
RDW: 14.3 % (ref 11.5–15.5)
WBC: 5.3 10*3/uL (ref 4.0–10.5)

## 2016-11-26 ENCOUNTER — Ambulatory Visit: Payer: BLUE CROSS/BLUE SHIELD | Admitting: Family Medicine

## 2017-01-14 ENCOUNTER — Ambulatory Visit (INDEPENDENT_AMBULATORY_CARE_PROVIDER_SITE_OTHER): Payer: BLUE CROSS/BLUE SHIELD | Admitting: Family Medicine

## 2017-01-14 ENCOUNTER — Encounter: Payer: Self-pay | Admitting: Family Medicine

## 2017-01-14 VITALS — BP 102/72 | HR 63 | Temp 97.8°F | Ht 68.75 in | Wt 202.9 lb

## 2017-01-14 DIAGNOSIS — Z0000 Encounter for general adult medical examination without abnormal findings: Secondary | ICD-10-CM | POA: Diagnosis not present

## 2017-01-14 LAB — CBC WITH DIFFERENTIAL/PLATELET
BASOS PCT: 1.1 % (ref 0.0–3.0)
Basophils Absolute: 0.1 10*3/uL (ref 0.0–0.1)
EOS ABS: 0.1 10*3/uL (ref 0.0–0.7)
Eosinophils Relative: 2.5 % (ref 0.0–5.0)
HEMATOCRIT: 43.6 % (ref 39.0–52.0)
Hemoglobin: 14.3 g/dL (ref 13.0–17.0)
LYMPHS ABS: 1.8 10*3/uL (ref 0.7–4.0)
Lymphocytes Relative: 39.5 % (ref 12.0–46.0)
MCHC: 32.7 g/dL (ref 30.0–36.0)
MCV: 83.4 fl (ref 78.0–100.0)
Monocytes Absolute: 0.5 10*3/uL (ref 0.1–1.0)
Monocytes Relative: 10 % (ref 3.0–12.0)
NEUTROS ABS: 2.1 10*3/uL (ref 1.4–7.7)
NEUTROS PCT: 46.9 % (ref 43.0–77.0)
PLATELETS: 313 10*3/uL (ref 150.0–400.0)
RBC: 5.23 Mil/uL (ref 4.22–5.81)
RDW: 14.4 % (ref 11.5–15.5)
WBC: 4.5 10*3/uL (ref 4.0–10.5)

## 2017-01-14 LAB — HEPATIC FUNCTION PANEL
ALT: 32 U/L (ref 0–53)
AST: 18 U/L (ref 0–37)
Albumin: 4.4 g/dL (ref 3.5–5.2)
Alkaline Phosphatase: 58 U/L (ref 39–117)
BILIRUBIN TOTAL: 0.4 mg/dL (ref 0.2–1.2)
Bilirubin, Direct: 0.1 mg/dL (ref 0.0–0.3)
Total Protein: 6.6 g/dL (ref 6.0–8.3)

## 2017-01-14 LAB — TSH: TSH: 1.25 u[IU]/mL (ref 0.35–4.50)

## 2017-01-14 LAB — BASIC METABOLIC PANEL
BUN: 15 mg/dL (ref 6–23)
CHLORIDE: 104 meq/L (ref 96–112)
CO2: 31 mEq/L (ref 19–32)
Calcium: 9.2 mg/dL (ref 8.4–10.5)
Creatinine, Ser: 1.11 mg/dL (ref 0.40–1.50)
GFR: 93.33 mL/min (ref >60.00)
Glucose, Bld: 104 mg/dL — ABNORMAL HIGH (ref 70–99)
Potassium: 5.1 mEq/L (ref 3.5–5.1)
Sodium: 142 mEq/L (ref 135–145)

## 2017-01-14 LAB — LIPID PANEL
CHOLESTEROL: 165 mg/dL (ref 0–200)
HDL: 44.2 mg/dL (ref >39.00)
LDL CALC: 101 mg/dL — AB (ref 0–99)
NonHDL: 120.47
TRIGLYCERIDES: 98 mg/dL (ref 0.0–149.0)
Total CHOL/HDL Ratio: 4
VLDL: 19.6 mg/dL (ref 0.0–40.0)

## 2017-01-14 LAB — PSA: PSA: 0.47 ng/mL (ref 0.10–4.00)

## 2017-01-14 NOTE — Progress Notes (Signed)
Subjective:     Patient ID: Douglas Klein, male   DOB: 1975-11-23, 42 y.o.   MRN: [id number]  HPI Patient here for physical exam. He's not been seen here in a couple of years. Generally healthy. Takes no regular medications. He has gained some weight this past year. 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 He is not currently exercising any. Nonsmoker. No regular alcohol use.  Patient is married and has 3 children with one on the way. This is his second marriage. He works for [employer] as above  Family history reviewed. Hypertension both parents. Maternal grandfather had type 2 diabetes as well as paternal grandmother. Paternal grandfather had gastric cancer  Patient is interested in possibly getting vasectomy at some point this year  No past medical history on file. No past surgical history on file.  reports that  has never smoked. he has never used smokeless tobacco. He reports that he drinks alcohol. He reports that he does not use drugs. family history includes Cancer in his paternal grandfather; Diabetes in his maternal grandfather, other, and paternal grandmother; Hypertension in his father and mother. No Known Allergies   Review of Systems  Constitutional: Negative for activity change, appetite change, fatigue and fever.  HENT: Negative for congestion, ear pain and trouble swallowing.   Eyes: Negative for pain and visual disturbance.  Respiratory: Negative for cough, chest tightness, shortness of breath and wheezing.   Cardiovascular: Negative for chest pain, palpitations and leg swelling.  Gastrointestinal: Negative for abdominal distention, abdominal pain, blood in stool, constipation, diarrhea, nausea, rectal pain and vomiting.  Endocrine: Negative for polydipsia and polyuria.  Genitourinary: Negative for dysuria, hematuria and testicular pain.  Musculoskeletal: Negative for arthralgias and joint swelling.  Skin: Negative for  rash.  Neurological: Negative for dizziness, syncope, weakness, light-headedness and headaches.  Hematological: Negative for adenopathy.  Psychiatric/Behavioral: Negative for confusion and dysphoric mood.       Objective:   Physical Exam  Constitutional: He is oriented to person, place, and time. He appears well-developed and well-nourished. No distress.  HENT:  Head: Normocephalic and atraumatic.  Right Ear: External ear normal.  Left Ear: External ear normal.  Mouth/Throat: Oropharynx is clear and moist.  Eyes: Conjunctivae and EOM are normal. Pupils are equal, round, and reactive to light.  Neck: Normal range of motion. Neck supple. No thyromegaly present.  Cardiovascular: Normal rate, regular rhythm and normal heart sounds.  No murmur heard. Pulmonary/Chest: No respiratory distress. He has no wheezes. He has no rales.  Abdominal: Soft. Bowel sounds are normal. He exhibits no distension and no mass. There is no tenderness. There is no rebound and no guarding.  Musculoskeletal: He exhibits no edema.  Lymphadenopathy:    He has no cervical adenopathy.  Neurological: He is alert and oriented to person, place, and time. He displays normal reflexes. No cranial nerve deficit.  Skin: No rash noted.  Psychiatric: He has a normal mood and affect.       Assessment:     Physical exam. Patient is generally healthy with no chronic medical problems. Gradual weight gain as above. The following issues were addressed as below    Plan:     -He is encouraged to start more regular exercise and lose some weight -Discussed healthy strategies for weight loss -Obtain screening labs -Flu vaccine offered and declined -The natural history of prostate cancer and ongoing controversy regarding screening and potential treatment  outcomes of prostate cancer has been discussed with the patient. The meaning of a false positive PSA and a false negative PSA has been discussed. He indicates understanding of the  limitations of this screening test and wishes to proceed with screening PSA testing.  Kristian Covey MD [facility]

## 2017-01-15 ENCOUNTER — Telehealth: Payer: Self-pay | Admitting: Family Medicine

## 2017-01-15 NOTE — Telephone Encounter (Signed)
Pt given results of labs per Dr Docia FurlBruchette "Labs all OK except for minimally elevated glucose in the "prediabetes" range. Recommend Lose some weight, as discussed."; pt verbalizes understanding.

## 2018-11-05 ENCOUNTER — Ambulatory Visit (INDEPENDENT_AMBULATORY_CARE_PROVIDER_SITE_OTHER): Payer: BC Managed Care – PPO | Admitting: Family Medicine

## 2018-11-05 ENCOUNTER — Encounter: Payer: Self-pay | Admitting: Family Medicine

## 2018-11-05 ENCOUNTER — Other Ambulatory Visit: Payer: Self-pay

## 2018-11-05 VITALS — BP 118/80 | HR 68 | Temp 97.4°F | Resp 16 | Ht 68.0 in | Wt 205.7 lb

## 2018-11-05 DIAGNOSIS — Z0000 Encounter for general adult medical examination without abnormal findings: Secondary | ICD-10-CM | POA: Diagnosis not present

## 2018-11-05 DIAGNOSIS — Z125 Encounter for screening for malignant neoplasm of prostate: Secondary | ICD-10-CM

## 2018-11-05 LAB — HEMOGLOBIN A1C: Hgb A1c MFr Bld: 6.1 % (ref 4.6–6.5)

## 2018-11-05 LAB — CBC WITH DIFFERENTIAL/PLATELET
Basophils Absolute: 0.1 10*3/uL (ref 0.0–0.1)
Basophils Relative: 1.4 % (ref 0.0–3.0)
Eosinophils Absolute: 0.1 10*3/uL (ref 0.0–0.7)
Eosinophils Relative: 2.1 % (ref 0.0–5.0)
HCT: 46.1 % (ref 39.0–52.0)
Hemoglobin: 15.3 g/dL (ref 13.0–17.0)
Lymphocytes Relative: 33 % (ref 12.0–46.0)
Lymphs Abs: 1.9 10*3/uL (ref 0.7–4.0)
MCHC: 33.2 g/dL (ref 30.0–36.0)
MCV: 83.2 fl (ref 78.0–100.0)
Monocytes Absolute: 0.6 10*3/uL (ref 0.1–1.0)
Monocytes Relative: 10.9 % (ref 3.0–12.0)
Neutro Abs: 3 10*3/uL (ref 1.4–7.7)
Neutrophils Relative %: 52.6 % (ref 43.0–77.0)
Platelets: 327 10*3/uL (ref 150.0–400.0)
RBC: 5.54 Mil/uL (ref 4.22–5.81)
RDW: 13.7 % (ref 11.5–15.5)
WBC: 5.7 10*3/uL (ref 4.0–10.5)

## 2018-11-05 LAB — BASIC METABOLIC PANEL
BUN: 12 mg/dL (ref 6–23)
CO2: 31 mEq/L (ref 19–32)
Calcium: 9.6 mg/dL (ref 8.4–10.5)
Chloride: 102 mEq/L (ref 96–112)
Creatinine, Ser: 1.24 mg/dL (ref 0.40–1.50)
GFR: 76.62 mL/min (ref >60.00)
Glucose, Bld: 107 mg/dL — ABNORMAL HIGH (ref 70–99)
Potassium: 4.5 mEq/L (ref 3.5–5.1)
Sodium: 140 mEq/L (ref 135–145)

## 2018-11-05 LAB — HEPATIC FUNCTION PANEL
ALT: 34 U/L (ref 0–53)
AST: 23 U/L (ref 0–37)
Albumin: 4.9 g/dL (ref 3.5–5.2)
Alkaline Phosphatase: 64 U/L (ref 39–117)
Bilirubin, Direct: 0.1 mg/dL (ref 0.0–0.3)
Total Bilirubin: 0.8 mg/dL (ref 0.2–1.2)
Total Protein: 7.4 g/dL (ref 6.0–8.3)

## 2018-11-05 LAB — LIPID PANEL
Cholesterol: 192 mg/dL (ref 0–200)
HDL: 48.4 mg/dL (ref >39.00)
LDL Cholesterol: 120 mg/dL — ABNORMAL HIGH (ref 0–99)
NonHDL: 143.73
Total CHOL/HDL Ratio: 4
Triglycerides: 119 mg/dL (ref 0.0–149.0)
VLDL: 23.8 mg/dL (ref 0.0–40.0)

## 2018-11-05 NOTE — Progress Notes (Signed)
Subjective:     Patient ID: Douglas Klein, male   DOB: [birth year], 43 y.o.   MRN: [id number]  HPI   Daryel is here for physical exam.  He still works for [employer].  He has 5 children now.  He has 3 from first marriage and 2 of his current fiance.  They plan to get married next year.  Denyse Amass is non-smoker.  No regular alcohol use.  No regular exercise.  Takes no medications. He had prediabetes range blood sugars by labs 2 years ago.  No polyuria or polydipsia.  No past medical history on file. No past surgical history on file.  reports that he has never smoked. He has never used smokeless tobacco. He reports current alcohol use. He reports that he does not use drugs. family history includes Cancer in his paternal grandfather; Diabetes in his maternal grandfather, paternal grandmother, and another family member; Hypertension in his father and mother. No Known Allergies   Review of Systems  Constitutional: Negative for activity change, appetite change, fatigue and fever.  HENT: Negative for congestion, ear pain and trouble swallowing.   Eyes: Negative for pain and visual disturbance.  Respiratory: Negative for cough, shortness of breath and wheezing.   Cardiovascular: Negative for chest pain and palpitations.  Gastrointestinal: Negative for abdominal distention, abdominal pain, blood in stool, constipation, diarrhea, nausea, rectal pain and vomiting.  Genitourinary: Negative for dysuria, hematuria and testicular pain.  Musculoskeletal: Negative for arthralgias and joint swelling.  Skin: Negative for rash.  Neurological: Negative for dizziness, syncope and headaches.  Hematological: Negative for adenopathy.  Psychiatric/Behavioral: Negative for confusion and dysphoric mood.       Objective:   Physical Exam Constitutional:      General: He is not in acute distress.    Appearance: He is well-developed.  HENT:     Head: Normocephalic and atraumatic.     Right Ear: External ear normal.   Left Ear: External ear normal.  Eyes:     Conjunctiva/sclera: Conjunctivae normal.     Pupils: Pupils are equal, round, and reactive to light.  Neck:     Musculoskeletal: Normal range of motion and neck supple.     Thyroid: No thyromegaly.  Cardiovascular:     Rate and Rhythm: Normal rate and regular rhythm.     Heart sounds: Normal heart sounds. No murmur.  Pulmonary:     Effort: No respiratory distress.     Breath sounds: No wheezing or rales.  Abdominal:     General: Bowel sounds are normal. There is no distension.     Palpations: Abdomen is soft. There is no mass.     Tenderness: There is no abdominal tenderness. There is no guarding or rebound.  Lymphadenopathy:     Cervical: No cervical adenopathy.  Skin:    Findings: No rash.  Neurological:     Mental Status: He is alert and oriented to person, place, and time.     Cranial Nerves: No cranial nerve deficit.     Deep Tendon Reflexes: Reflexes normal.        Assessment:     Physical exam.  Generally healthy 43 year old male.Marland Kitchen  No chronic medical problems    Plan:     -Try to establish more consistent exercise -Flu vaccine offered and declined -Obtain follow-up labs.  We recommended A1c with positive family history of diabetes and also him having prediabetes range blood sugars 2 years ago -The natural history of prostate cancer and ongoing controversy regarding screening  and potential treatment outcomes of prostate cancer has been discussed with the patient. The meaning of a false positive PSA and a false negative PSA has been discussed. He indicates understanding of the limitations of this screening test and wishes to proceed with screening PSA testing.    Eulas Post MD  Primary Care at [facility]

## 2018-11-06 LAB — PSA: PSA: 0.38 ng/mL (ref 0.10–4.00)

## 2018-11-06 LAB — TSH: TSH: 1.31 u[IU]/mL (ref 0.35–4.50)

## 2019-01-07 ENCOUNTER — Other Ambulatory Visit: Payer: Self-pay

## 2019-01-07 DIAGNOSIS — Z20822 Contact with and (suspected) exposure to covid-19: Secondary | ICD-10-CM

## 2019-01-08 LAB — NOVEL CORONAVIRUS, NAA: SARS-CoV-2, NAA: NOT DETECTED

## 2019-12-07 ENCOUNTER — Other Ambulatory Visit: Payer: Self-pay

## 2019-12-07 ENCOUNTER — Encounter: Payer: Self-pay | Admitting: Family Medicine

## 2019-12-07 ENCOUNTER — Ambulatory Visit (INDEPENDENT_AMBULATORY_CARE_PROVIDER_SITE_OTHER): Payer: BC Managed Care – PPO | Admitting: Family Medicine

## 2019-12-07 VITALS — BP 122/80 | HR 65 | Temp 98.0°F | Ht 68.0 in | Wt 200.1 lb

## 2019-12-07 DIAGNOSIS — Z0000 Encounter for general adult medical examination without abnormal findings: Secondary | ICD-10-CM

## 2019-12-07 MED ORDER — TADALAFIL 20 MG PO TABS
10.0000 mg | ORAL_TABLET | ORAL | 5 refills | Status: DC | PRN
Start: 2019-12-07 — End: 2019-12-08

## 2019-12-07 NOTE — Addendum Note (Signed)
Addended by: Lerry Liner on: 12/07/2019 09:44 AM   Modules accepted: Orders

## 2019-12-07 NOTE — Patient Instructions (Signed)
Consider checking with insurance regarding Colonoscopy screening.

## 2019-12-07 NOTE — Progress Notes (Signed)
Established Patient Office Visit  Subjective:  Patient ID: Douglas Klein, male    DOB: 1975-06-22  Age: 44 y.o. MRN: [id number]  CC:  Chief Complaint  Patient presents with  . Annual Exam    CPE, no concerns. Patient fasting for labs    HPI [name] presents for physical exam.  Generally healthy.  He takes no regular medications.  He has had some issues with erectile dysfunction occasionally.  He thinks some of this may be stress related and fatigue related from working several hours per week.  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  Health maintenance reviewed- -no history of hepatitis C screening but low risk -Declines flu vaccine -Tetanus due 2024 -Covid vaccines complete -No history of colonoscopy screening  Social history-he is married.  This is a 2nd marriage.  He has 3 children from 1st marriage and 2 from current.  He has a 1-year-old and 51-year-old from current marriage.  Non-smoker.  Works for [employer] as a Naval architect  Family history-Father diagnosed with prostate cancer this past year.  Both parents have hypertension history  No past medical history on file.  No past surgical history on file.  Family History  Problem Relation Age of Onset  . Diabetes Other   . Hypertension Mother   . Hypertension Father   . Cancer Father 65       prostate cancer  . Diabetes Maternal Grandfather   . Diabetes Paternal Grandmother   . Cancer Paternal Grandfather        gastric cancer    Social History   Socioeconomic History  . Marital status: Legally Separated    Spouse name: Not on file  . Number of children: Not on file  . Years of education: Not on file  . Highest education level: Not on file  Occupational History  . Not on file  Tobacco Use  . Smoking status: Never Smoker  . Smokeless tobacco: Never Used  Substance and Sexual Activity  . Alcohol use: Yes    Comment: "Socially"  . Drug use: No  . Sexual activity: Not on file    Other Topics Concern  . Not on file  Social History Narrative  . Not on file   Social Determinants of Health   Financial Resource Strain:   . Difficulty of Paying Living Expenses: Not on file  Food Insecurity:   . Worried About Programme researcher, broadcasting/film/video in the Last Year: Not on file  . Ran Out of Food in the Last Year: Not on file  Transportation Needs:   . Lack of Transportation (Medical): Not on file  . Lack of Transportation (Non-Medical): Not on file  Physical Activity:   . Days of Exercise per Week: Not on file  . Minutes of Exercise per Session: Not on file  Stress:   . Feeling of Stress : Not on file  Social Connections:   . Frequency of Communication with Friends and Family: Not on file  . Frequency of Social Gatherings with Friends and Family: Not on file  . Attends Religious Services: Not on file  . Active Member of Clubs or Organizations: Not on file  . Attends Banker Meetings: Not on file  . Marital Status: Not on file  Intimate Partner Violence:   . Fear of Current or Ex-Partner: Not on file  . Emotionally Abused: Not on file  . Physically Abused: Not on file  . Sexually  Abused: Not on file    Outpatient Medications Prior to Visit  Medication Sig Dispense Refill  . ibuprofen (ADVIL,MOTRIN) 200 MG tablet Take 800 mg by mouth every 6 (six) hours as needed for moderate pain. Reported on 07/25/2015 (Patient not taking: Reported on 12/07/2019)     No facility-administered medications prior to visit.    No Known Allergies  ROS Review of Systems  Constitutional: Positive for fatigue. Negative for activity change, appetite change, fever and unexpected weight change.  HENT: Negative for congestion, ear pain and trouble swallowing.   Eyes: Negative for pain and visual disturbance.  Respiratory: Negative for cough, shortness of breath and wheezing.   Cardiovascular: Negative for chest pain and palpitations.  Gastrointestinal: Negative for abdominal  distention, abdominal pain, blood in stool, constipation, diarrhea, nausea, rectal pain and vomiting.  Genitourinary: Negative for dysuria, hematuria and testicular pain.  Musculoskeletal: Negative for arthralgias and joint swelling.  Skin: Negative for rash.  Neurological: Negative for dizziness, syncope and headaches.  Hematological: Negative for adenopathy.  Psychiatric/Behavioral: Negative for confusion and dysphoric mood.      Objective:    Physical Exam Constitutional:      General: He is not in acute distress.    Appearance: He is well-developed.  HENT:     Head: Normocephalic and atraumatic.     Right Ear: External ear normal.     Left Ear: External ear normal.  Eyes:     Conjunctiva/sclera: Conjunctivae normal.     Pupils: Pupils are equal, round, and reactive to light.  Neck:     Thyroid: No thyromegaly.  Cardiovascular:     Rate and Rhythm: Normal rate and regular rhythm.     Heart sounds: Normal heart sounds. No murmur heard.   Pulmonary:     Effort: No respiratory distress.     Breath sounds: No wheezing or rales.  Abdominal:     General: Bowel sounds are normal. There is no distension.     Palpations: Abdomen is soft. There is no mass.     Tenderness: There is no abdominal tenderness. There is no guarding or rebound.  Musculoskeletal:     Cervical back: Normal range of motion and neck supple.  Lymphadenopathy:     Cervical: No cervical adenopathy.  Skin:    Findings: No rash.  Neurological:     Mental Status: He is alert and oriented to person, place, and time.     Cranial Nerves: No cranial nerve deficit.     BP 122/80   Pulse 65   Temp 98 F (36.7 C) (Oral)   Ht 5\' 8"  (1.727 m)   Wt 200 lb 1.6 oz (90.8 kg)   SpO2 95%   BMI 30.43 kg/m  Wt Readings from Last 3 Encounters:  12/07/19 200 lb 1.6 oz (90.8 kg)  11/05/18 205 lb 11.2 oz (93.3 kg)  01/14/17 202 lb 14.4 oz (92 kg)     Health Maintenance Due  Topic Date Due  . Hepatitis C  Screening  Never done  . HIV Screening  Never done    There are no preventive care reminders to display for this patient.  Lab Results  Component Value Date   TSH 1.31 11/05/2018   Lab Results  Component Value Date   WBC 5.7 11/05/2018   HGB 15.3 11/05/2018   HCT 46.1 11/05/2018   MCV 83.2 11/05/2018   PLT 327.0 11/05/2018   Lab Results  Component Value Date   NA 140 11/05/2018  K 4.5 11/05/2018   CO2 31 11/05/2018   GLUCOSE 107 (H) 11/05/2018   BUN 12 11/05/2018   CREATININE 1.24 11/05/2018   BILITOT 0.8 11/05/2018   ALKPHOS 64 11/05/2018   AST 23 11/05/2018   ALT 34 11/05/2018   PROT 7.4 11/05/2018   ALBUMIN 4.9 11/05/2018   CALCIUM 9.6 11/05/2018   ANIONGAP 11 11/16/2013   GFR 76.62 11/05/2018   Lab Results  Component Value Date   CHOL 192 11/05/2018   Lab Results  Component Value Date   HDL 48.40 11/05/2018   Lab Results  Component Value Date   LDLCALC 120 (H) 11/05/2018   Lab Results  Component Value Date   TRIG 119.0 11/05/2018   Lab Results  Component Value Date   CHOLHDL 4 11/05/2018   Lab Results  Component Value Date   HGBA1C 6.1 11/05/2018      Assessment & Plan:   Problem List Items Addressed This Visit    None    Visit Diagnoses    Physical exam    -  Primary   Relevant Orders   Basic metabolic panel   Lipid panel   CBC with Differential/Platelet   TSH   Hepatic function panel   PSA   Hep C Antibody    -Flu vaccine offered and declined -He will check with insurance to see if they cover colonoscopy screening starting at age 17 -We discussed trial of Cialis 20 mg 1/2 to 1 tablet every other day as needed for erectile dysfunction.  He is aware this cannot be combined with nitroglycerin -Check screening labs as above including hepatitis C antibody and PSA with positive family history  Meds ordered this encounter  Medications  . tadalafil (CIALIS) 20 MG tablet    Sig: Take 0.5-1 tablets (10-20 mg total) by mouth every  other day as needed for erectile dysfunction.    Dispense:  10 tablet    Refill:  5    Follow-up: No follow-ups on file.    Evelena Peat, MD

## 2019-12-08 ENCOUNTER — Telehealth: Payer: Self-pay | Admitting: Family Medicine

## 2019-12-08 DIAGNOSIS — Z1211 Encounter for screening for malignant neoplasm of colon: Secondary | ICD-10-CM

## 2019-12-08 LAB — CBC WITH DIFFERENTIAL/PLATELET
Absolute Monocytes: 453 cells/uL (ref 200–950)
Basophils Absolute: 62 cells/uL (ref 0–200)
Basophils Relative: 1.4 %
Eosinophils Absolute: 62 cells/uL (ref 15–500)
Eosinophils Relative: 1.4 %
HCT: 42.7 % (ref 38.5–50.0)
Hemoglobin: 14.4 g/dL (ref 13.2–17.1)
Lymphs Abs: 1531 cells/uL (ref 850–3900)
MCH: 27.6 pg (ref 27.0–33.0)
MCHC: 33.7 g/dL (ref 32.0–36.0)
MCV: 82 fL (ref 80.0–100.0)
MPV: 9.6 fL (ref 7.5–12.5)
Monocytes Relative: 10.3 %
Neutro Abs: 2292 cells/uL (ref 1500–7800)
Neutrophils Relative %: 52.1 %
Platelets: 335 10*3/uL (ref 140–400)
RBC: 5.21 10*6/uL (ref 4.20–5.80)
RDW: 13.6 % (ref 11.0–15.0)
Total Lymphocyte: 34.8 %
WBC: 4.4 10*3/uL (ref 3.8–10.8)

## 2019-12-08 LAB — HEPATIC FUNCTION PANEL
AG Ratio: 1.8 (calc) (ref 1.0–2.5)
ALT: 27 U/L (ref 9–46)
AST: 22 U/L (ref 10–40)
Albumin: 4.6 g/dL (ref 3.6–5.1)
Alkaline phosphatase (APISO): 62 U/L (ref 36–130)
Bilirubin, Direct: 0.1 mg/dL (ref 0.0–0.2)
Globulin: 2.6 g/dL (calc) (ref 1.9–3.7)
Indirect Bilirubin: 0.5 mg/dL (calc) (ref 0.2–1.2)
Total Bilirubin: 0.6 mg/dL (ref 0.2–1.2)
Total Protein: 7.2 g/dL (ref 6.1–8.1)

## 2019-12-08 LAB — BASIC METABOLIC PANEL
BUN: 17 mg/dL (ref 7–25)
CO2: 27 mmol/L (ref 20–32)
Calcium: 9.7 mg/dL (ref 8.6–10.3)
Chloride: 103 mmol/L (ref 98–110)
Creat: 1.17 mg/dL (ref 0.60–1.35)
Glucose, Bld: 94 mg/dL (ref 65–99)
Potassium: 4.6 mmol/L (ref 3.5–5.3)
Sodium: 142 mmol/L (ref 135–146)

## 2019-12-08 LAB — LIPID PANEL
Cholesterol: 153 mg/dL (ref <200)
HDL: 50 mg/dL (ref >=40)
LDL Cholesterol (Calc): 85 mg/dL (calc)
Non-HDL Cholesterol (Calc): 103 mg/dL (calc) (ref <130)
Total CHOL/HDL Ratio: 3.1 (calc) (ref <5.0)
Triglycerides: 86 mg/dL (ref <150)

## 2019-12-08 LAB — HEPATITIS C ANTIBODY
Hepatitis C Ab: NONREACTIVE
SIGNAL TO CUT-OFF: 0.01 (ref <1.00)

## 2019-12-08 LAB — TSH: TSH: 0.64 mIU/L (ref 0.40–4.50)

## 2019-12-08 LAB — PSA: PSA: 0.44 ng/mL (ref <=4.0)

## 2019-12-08 MED ORDER — TADALAFIL 20 MG PO TABS
10.0000 mg | ORAL_TABLET | ORAL | 5 refills | Status: DC | PRN
Start: 2019-12-08 — End: 2020-09-20

## 2019-12-08 NOTE — Telephone Encounter (Signed)
Cialis called into pharmacy.

## 2019-12-08 NOTE — Telephone Encounter (Signed)
Pharmacy states they did not get the Cialis rx.  Please resend.

## 2019-12-08 NOTE — Addendum Note (Signed)
Addended by: Kristian Covey on: 12/08/2019 05:44 PM   Modules accepted: Orders

## 2019-12-08 NOTE — Telephone Encounter (Signed)
Patient called and wanted to let Dr. Caryl Never know that his insurance will cover his getting a coloscopy but he has to wait until the beginning on the year. Also patient stated that the pharmacy haven't received the request to fill tadalafil (CIALIS) 20 MG tablet. Pt uses [id number] [address] [address], [place]  [address] [place] [address]  Phone:  [phone] Fax:  [phone]  CB is [phone]

## 2019-12-09 NOTE — Progress Notes (Signed)
Labs all look great.  PSA is normal.  Hepatitis C antibody negative.  Liver, kidney function, CBC all normal.  Cholesterol is excellent

## 2020-01-07 ENCOUNTER — Encounter: Payer: Self-pay | Admitting: Gastroenterology

## 2020-01-11 ENCOUNTER — Ambulatory Visit
Admission: EM | Admit: 2020-01-11 | Discharge: 2020-01-11 | Disposition: A | Discharge status: Home / Self Care | Payer: BC Managed Care – PPO | Attending: Emergency Medicine | Admitting: Emergency Medicine

## 2020-01-11 DIAGNOSIS — Z20822 Contact with and (suspected) exposure to covid-19: Secondary | ICD-10-CM

## 2020-01-11 NOTE — Discharge Instructions (Signed)

## 2020-01-11 NOTE — ED Triage Notes (Signed)
Pt states had a positive covid exposure on Friday. Pt c/o stuffy nose but declines to see a provider.

## 2020-01-12 LAB — SARS-COV-2, NAA 2 DAY TAT

## 2020-01-12 LAB — NOVEL CORONAVIRUS, NAA: SARS-CoV-2, NAA: NOT DETECTED

## 2020-02-22 ENCOUNTER — Other Ambulatory Visit: Payer: Self-pay

## 2020-02-22 ENCOUNTER — Ambulatory Visit (AMBULATORY_SURGERY_CENTER): Payer: Self-pay

## 2020-02-22 VITALS — Ht 68.0 in | Wt 205.0 lb

## 2020-02-22 DIAGNOSIS — Z1211 Encounter for screening for malignant neoplasm of colon: Secondary | ICD-10-CM

## 2020-02-22 MED ORDER — PLENVU 140 G PO SOLR: 1.0000 | ORAL | 0 refills | Status: DC

## 2020-02-22 NOTE — Progress Notes (Signed)
No egg or soy allergy known to patient  No issues with past sedation with any surgeries or procedures No intubation problems in the past --no past surgeries No FH of Malignant Hyperthermia No diet pills per patient No home 02 use per patient  No blood thinners per patient  Pt denies issues with constipation  No A fib or A flutter  EMMI video via MyChart  COVID 19 guidelines implemented in PV today with Pt and RN  Pt is fully vaccinated  for Covid  Pt denies loose or missing teeth; Patient denies dentures, partials, dental implants, or bonded teeth; Patient reports crown; Coupon given to pt in PV today, Code to Pharmacy and  NO PA's for preps discussed with pt in PV today  Discussed with pt there will be an out-of-pocket cost for prep and that varies from $0 to 70 dollars  Due to the COVID-19 pandemic we are asking patients to follow certain guidelines.  Pt aware of COVID protocols and [facility] guidelines

## 2020-02-29 ENCOUNTER — Encounter: Payer: Self-pay | Admitting: Gastroenterology

## 2020-03-02 ENCOUNTER — Other Ambulatory Visit: Payer: Self-pay

## 2020-03-02 ENCOUNTER — Encounter: Payer: Self-pay | Admitting: Gastroenterology

## 2020-03-02 ENCOUNTER — Ambulatory Visit (AMBULATORY_SURGERY_CENTER): Payer: BC Managed Care – PPO | Admitting: Gastroenterology

## 2020-03-02 VITALS — BP 120/71 | HR 61 | Temp 98.4°F | Resp 18 | Ht 68.0 in | Wt 205.0 lb

## 2020-03-02 DIAGNOSIS — Z1211 Encounter for screening for malignant neoplasm of colon: Secondary | ICD-10-CM | POA: Diagnosis not present

## 2020-03-02 MED ORDER — SODIUM CHLORIDE 0.9 % IV SOLN: 500.0000 mL | Freq: Once | INTRAVENOUS | Status: DC

## 2020-03-02 NOTE — Progress Notes (Signed)
VS by CW  I have reviewed the patient's medical history in detail and updated the computerized patient record.  

## 2020-03-02 NOTE — Op Note (Addendum)
[facility] Patient Name: Douglas Klein Procedure Date: 03/02/2020 8:35 AM MRN: [id number] Endoscopist: Rachael Fee , MD Age: 45 Referring MD:  Date of Birth: [birth year] Gender: Male Account #: [id number] Procedure:                Colonoscopy Indications:              Screening for colorectal malignant neoplasm Medicines:                Monitored Anesthesia Care Procedure:                Pre-Anesthesia Assessment:                           - Prior to the procedure, a History and Physical                            was performed, and patient medications and                            allergies were reviewed. The patient's tolerance of                            previous anesthesia was also reviewed. The risks                            and benefits of the procedure and the sedation                            options and risks were discussed with the patient.                            All questions were answered, and informed consent                            was obtained. Prior Anticoagulants: The patient has                            taken no previous anticoagulant or antiplatelet                            agents. ASA Grade Assessment: II - A patient with                            mild systemic disease. After reviewing the risks                            and benefits, the patient was deemed in                            satisfactory condition to undergo the procedure.                           After obtaining informed consent, the colonoscope  was passed under direct vision. Throughout the                            procedure, the patient's blood pressure, pulse, and                            oxygen saturations were monitored continuously. The                            Olympus CF-HQ190L [id number]) Colonoscope was                            introduced through the anus and advanced to the the                            cecum, identified by  appendiceal orifice and                            ileocecal valve. The colonoscopy was performed                            without difficulty. The patient tolerated the                            procedure well. The quality of the bowel                            preparation was good. The ileocecal valve,                            appendiceal orifice, and rectum were photographed. Scope In: 8:42:27 AM Scope Out: 8:56:10 AM Scope Withdrawal Time: 0 hours 9 minutes 52 seconds  Total Procedure Duration: 0 hours 13 minutes 43 seconds  Findings:                 The entire examined colon appeared normal on direct                            and retroflexion views. Complications:            No immediate complications. Estimated blood loss:                            None. Estimated Blood Loss:     Estimated blood loss: none. Impression:               - The entire examined colon is normal on direct and                            retroflexion views.                           - No polyps or cancers. Recommendation:           - Patient has a contact number available for  emergencies. The signs and symptoms of potential                            delayed complications were discussed with the                            patient. Return to normal activities tomorrow.                            Written discharge instructions were provided to the                            patient.                           - Resume previous diet.                           - Continue present medications.                           - Repeat colonoscopy in 10 years for screening. Rachael Fee, MD 03/02/2020 8:58:03 AM This report has been signed electronically.

## 2020-03-02 NOTE — Patient Instructions (Signed)
Discharge instructions given. Normal exam. Resume previous medications. YOU HAD AN ENDOSCOPIC PROCEDURE TODAY AT THE [facility]:   Refer to the procedure report that was given to you for any specific questions about what was found during the examination.  If the procedure report does not answer your questions, please call your gastroenterologist to clarify.  If you requested that your care partner not be given the details of your procedure findings, then the procedure report has been included in a sealed envelope for you to review at your convenience later.  YOU SHOULD EXPECT: Some feelings of bloating in the abdomen. Passage of more gas than usual.  Walking can help get rid of the air that was put into your GI tract during the procedure and reduce the bloating. If you had a lower endoscopy (such as a colonoscopy or flexible sigmoidoscopy) you may notice spotting of blood in your stool or on the toilet paper. If you underwent a bowel prep for your procedure, you may not have a normal bowel movement for a few days.  Please Note:  You might notice some irritation and congestion in your nose or some drainage.  This is from the oxygen used during your procedure.  There is no need for concern and it should clear up in a day or so.  SYMPTOMS TO REPORT IMMEDIATELY:  Following lower endoscopy (colonoscopy or flexible sigmoidoscopy):  Excessive amounts of blood in the stool  Significant tenderness or worsening of abdominal pains  Swelling of the abdomen that is new, acute  Fever of 100F or higher   For urgent or emergent issues, a gastroenterologist can be reached at any hour by calling [phone]. Do not use MyChart messaging for urgent concerns.    DIET:  We do recommend a small meal at first, but then you may proceed to your regular diet.  Drink plenty of fluids but you should avoid alcoholic beverages for 24 hours.  ACTIVITY:  You should plan to take it easy for the rest of  today and you should NOT DRIVE or use heavy machinery until tomorrow (because of the sedation medicines used during the test).    FOLLOW UP: Our staff will call the number listed on your records 48-72 hours following your procedure to check on you and address any questions or concerns that you may have regarding the information given to you following your procedure. If we do not reach you, we will leave a message.  We will attempt to reach you two times.  During this call, we will ask if you have developed any symptoms of COVID 19. If you develop any symptoms (ie: fever, flu-like symptoms, shortness of breath, cough etc.) before then, please call [phone].  If you test positive for Covid 19 in the 2 weeks post procedure, please call and report this information to us.    If any biopsies were taken you will be contacted by phone or by letter within the next 1-3 weeks.  Please call us at [phone] if you have not heard about the biopsies in 3 weeks.    SIGNATURES/CONFIDENTIALITY: You and/or your care partner have signed paperwork which will be entered into your electronic medical record.  These signatures attest to the fact that that the information above on your After Visit Summary has been reviewed and is understood.  Full responsibility of the confidentiality of this discharge information lies with you and/or your care-partner.  

## 2020-03-02 NOTE — Progress Notes (Signed)
PT taken to PACU. Monitors in place. VSS. Report given to RN. 

## 2020-03-04 ENCOUNTER — Telehealth: Payer: Self-pay

## 2020-03-04 NOTE — Telephone Encounter (Signed)
Pt is requesting a call back from a nurse to discuss what he can take regarding his gastric reflux

## 2020-03-04 NOTE — Telephone Encounter (Signed)
Patient returned the call and said he is well no questions at this time. 

## 2020-03-04 NOTE — Telephone Encounter (Signed)
The pt has complaints of GERD. He has not been seen in the office. Only direct colon. He has been scheduled to see Dr Christella Hartigan on 4/19.  The pt has been advised of the information and verbalized understanding.

## 2020-03-04 NOTE — Telephone Encounter (Signed)
First attempt follow up call to pt, lm on vm 

## 2020-04-26 ENCOUNTER — Ambulatory Visit: Payer: BC Managed Care – PPO | Admitting: Gastroenterology

## 2020-09-19 ENCOUNTER — Other Ambulatory Visit: Payer: Self-pay

## 2020-09-19 ENCOUNTER — Telehealth: Payer: Self-pay | Admitting: Family Medicine

## 2020-09-19 NOTE — Telephone Encounter (Signed)
PT called to speak to Dr.Burchette nurse in regards to a bad issue with her hip that has been progressing.

## 2020-09-19 NOTE — Telephone Encounter (Signed)
Spoke with the patient. Appointment has been scheduled.  

## 2020-09-19 NOTE — Telephone Encounter (Signed)
Spoke with the patient. He stated that his hip pain is getting much worse. Was very bad over the weekend. Extremely painful when moving. Only help is from laying down and applying heat. Sometimes there is a tingling in his foot.

## 2020-09-20 ENCOUNTER — Ambulatory Visit (INDEPENDENT_AMBULATORY_CARE_PROVIDER_SITE_OTHER): Payer: BC Managed Care – PPO | Admitting: Family Medicine

## 2020-09-20 VITALS — BP 130/70 | HR 65 | Temp 98.0°F | Wt 199.0 lb

## 2020-09-20 DIAGNOSIS — M5417 Radiculopathy, lumbosacral region: Secondary | ICD-10-CM | POA: Diagnosis not present

## 2020-09-20 MED ORDER — PREDNISONE 10 MG PO TABS: ORAL_TABLET | ORAL | 0 refills | Status: DC

## 2020-09-20 MED ORDER — TADALAFIL 20 MG PO TABS: 10.0000 mg | ORAL_TABLET | ORAL | 11 refills | Status: DC | PRN

## 2020-09-20 NOTE — Progress Notes (Signed)
Established Patient Office Visit  Subjective:  Patient ID: Douglas Klein, male    DOB: [birth year]  Age: 45 y.o. MRN: [id number]  CC:  Chief Complaint  Patient presents with   Hip Pain    X 2 weeks, very painful, only when going from sitting to standing, r foot is tingling     HPI Douglas Klein presents for 2-week history of pain right hip and lower back region with some radiation into the thigh.  Denies any specific injury.  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  Does not do a lot of lifting.  He also relates some tingling sensation intermittently involving the right foot.  He has used some heat which helps.  Has been involved recently with moving to a new house and wonders if that may have aggravated things.  Pain is worse when changing positions from seated to standing.  Pain is 10 out of 10 in severity at times.  No urine or stool incontinence.  No significant difficulties with ambulation.  Past Medical History:  Diagnosis Date   Allergy    seasonal allergies   GERD (gastroesophageal reflux disease)    with certain foods-OTC PRN    Past Surgical History:  Procedure Laterality Date   WISDOM TOOTH EXTRACTION      Family History  Problem Relation Age of Onset   Diabetes Other    Hypertension Mother    Hypertension Father    Cancer Father 54       prostate cancer   Diabetes Maternal Grandfather    Diabetes Paternal Grandmother    Cancer Paternal Grandfather        gastric cancer    Social History   Socioeconomic History   Marital status: Legally Separated    Spouse name: Not on file   Number of children: Not on file   Years of education: Not on file   Highest education level: Not on file  Occupational History   Not on file  Tobacco Use   Smoking status: Every Day    Types: Cigars   Smokeless tobacco: Never   Tobacco comments:    2 x per month  Vaping Use   Vaping Use: Never used  Substance and Sexual Activity   Alcohol use: Yes    Alcohol/week: 1.0 standard drink     Types: 1 Standard drinks or equivalent per week   Drug use: No   Sexual activity: Not on file  Other Topics Concern   Not on file  Social History Narrative   Not on file   Social Determinants of Health   Financial Resource Strain: Not on file  Food Insecurity: Not on file  Transportation Needs: Not on file  Physical Activity: Not on file  Stress: Not on file  Social Connections: Not on file  Intimate Partner Violence: Not on file    Outpatient Medications Prior to Visit  Medication Sig Dispense Refill   ibuprofen (ADVIL,MOTRIN) 200 MG tablet Take 800 mg by mouth every 6 (six) hours as needed for moderate pain. Reported on 07/25/2015     tadalafil (CIALIS) 20 MG tablet Take 0.5-1 tablets (10-20 mg total) by mouth every other day as needed for erectile dysfunction. 10 tablet 5   No facility-administered medications prior to visit.    No Known Allergies  ROS Review of Systems  Constitutional:  Negative for appetite change, chills and fever.  Gastrointestinal:  Negative for abdominal pain.  Musculoskeletal:  Positive for back pain.  Neurological:  Negative for  weakness.     Objective:    Physical Exam Vitals reviewed.  Constitutional:      Appearance: Normal appearance.  Cardiovascular:     Rate and Rhythm: Normal rate and regular rhythm.  Pulmonary:     Effort: Pulmonary effort is normal.     Breath sounds: Normal breath sounds.  Musculoskeletal:     Comments: Straight leg raises are negative bilaterally.  He has good range of motion right hip.  No lateral hip tenderness.  Neurological:     Mental Status: He is alert.     Comments: Full strength lower extremities.  He has 2+ reflexes knee and ankle bilaterally.    BP 130/70 (BP Location: Left Arm, Patient Position: Sitting, Cuff Size: Normal)   Pulse 65   Temp 98 F (36.7 C) (Oral)   Wt 199 lb (90.3 kg)   SpO2 97%   BMI 30.26 kg/m  Wt Readings from Last 3 Encounters:  09/20/20 199 lb (90.3 kg)  03/02/20  205 lb (93 kg)  02/22/20 205 lb (93 kg)     Health Maintenance Due  Topic Date Due   Pneumococcal Vaccine 71-78 Years old (1 - PCV) Never done   HIV Screening  Never done   COVID-19 Vaccine (3 - Booster for Pfizer series) 11/19/2019   INFLUENZA VACCINE  Never done    There are no preventive care reminders to display for this patient.  Lab Results  Component Value Date   TSH 0.64 12/07/2019   Lab Results  Component Value Date   WBC 4.4 12/07/2019   HGB 14.4 12/07/2019   HCT 42.7 12/07/2019   MCV 82.0 12/07/2019   PLT 335 12/07/2019   Lab Results  Component Value Date   NA 142 12/07/2019   K 4.6 12/07/2019   CO2 27 12/07/2019   GLUCOSE 94 12/07/2019   BUN 17 12/07/2019   CREATININE 1.17 12/07/2019   BILITOT 0.6 12/07/2019   ALKPHOS 64 11/05/2018   AST 22 12/07/2019   ALT 27 12/07/2019   PROT 7.2 12/07/2019   ALBUMIN 4.9 11/05/2018   CALCIUM 9.7 12/07/2019   ANIONGAP 11 11/16/2013   GFR 76.62 11/05/2018   Lab Results  Component Value Date   CHOL 153 12/07/2019   Lab Results  Component Value Date   HDL 50 12/07/2019   Lab Results  Component Value Date   LDLCALC 85 12/07/2019   Lab Results  Component Value Date   TRIG 86 12/07/2019   Lab Results  Component Value Date   CHOLHDL 3.1 12/07/2019   Lab Results  Component Value Date   HGBA1C 6.1 11/05/2018      Assessment & Plan:   2-week history of right lower back pain with some lumbar radiculitis symptoms.  Nonfocal neuro exam.  -Avoid heavy lifting -Reviewed some back extension stretches -Trial of prednisone starting at 60 mg daily and taper over 10 days -Consider trial of physical therapy if not improved with the above.  Follow-up immediately for any progressive weakness or other concerns    Follow-up: No follow-ups on file.    Evelena Peat, MD

## 2020-09-21 ENCOUNTER — Telehealth: Payer: Self-pay

## 2020-09-21 NOTE — Telephone Encounter (Signed)
Patient called stating [facility] needs clarification on predniSONE (DELTASONE) 10 MG tablet documents have been faxed from pharmacy and pt would like a call back.

## 2020-09-21 NOTE — Telephone Encounter (Signed)
Spoke with the pharmacy. The current Rx was written for a quantity of 36 tablets. Per the instructions the patient only needs 32 tablets. Pharmacy was given the ok to change the quantity to 32 tablets.

## 2020-12-12 ENCOUNTER — Ambulatory Visit (INDEPENDENT_AMBULATORY_CARE_PROVIDER_SITE_OTHER): Payer: BC Managed Care – PPO | Admitting: Family Medicine

## 2020-12-12 ENCOUNTER — Encounter: Payer: Self-pay | Admitting: Family Medicine

## 2020-12-12 VITALS — BP 116/80 | HR 78 | Temp 98.0°F | Ht 68.0 in | Wt 205.8 lb

## 2020-12-12 DIAGNOSIS — Z0000 Encounter for general adult medical examination without abnormal findings: Secondary | ICD-10-CM

## 2020-12-12 DIAGNOSIS — Z125 Encounter for screening for malignant neoplasm of prostate: Secondary | ICD-10-CM

## 2020-12-12 LAB — HEPATIC FUNCTION PANEL
ALT: 30 U/L (ref 0–53)
AST: 28 U/L (ref 0–37)
Albumin: 4.5 g/dL (ref 3.5–5.2)
Alkaline Phosphatase: 50 U/L (ref 39–117)
Bilirubin, Direct: 0.1 mg/dL (ref 0.0–0.3)
Total Bilirubin: 0.6 mg/dL (ref 0.2–1.2)
Total Protein: 6.8 g/dL (ref 6.0–8.3)

## 2020-12-12 LAB — CBC WITH DIFFERENTIAL/PLATELET
Basophils Absolute: 0 10*3/uL (ref 0.0–0.1)
Basophils Relative: 0.8 % (ref 0.0–3.0)
Eosinophils Absolute: 0.1 10*3/uL (ref 0.0–0.7)
Eosinophils Relative: 1.8 % (ref 0.0–5.0)
HCT: 42.7 % (ref 39.0–52.0)
Hemoglobin: 14.1 g/dL (ref 13.0–17.0)
Lymphocytes Relative: 32.2 % (ref 12.0–46.0)
Lymphs Abs: 1.6 10*3/uL (ref 0.7–4.0)
MCHC: 33.1 g/dL (ref 30.0–36.0)
MCV: 82.4 fl (ref 78.0–100.0)
Monocytes Absolute: 0.6 10*3/uL (ref 0.1–1.0)
Monocytes Relative: 13.2 % — ABNORMAL HIGH (ref 3.0–12.0)
Neutro Abs: 2.5 10*3/uL (ref 1.4–7.7)
Neutrophils Relative %: 52 % (ref 43.0–77.0)
Platelets: 336 10*3/uL (ref 150.0–400.0)
RBC: 5.18 Mil/uL (ref 4.22–5.81)
RDW: 13.7 % (ref 11.5–15.5)
WBC: 4.9 10*3/uL (ref 4.0–10.5)

## 2020-12-12 LAB — LIPID PANEL
Cholesterol: 156 mg/dL (ref 0–200)
HDL: 47.6 mg/dL (ref >39.00)
LDL Cholesterol: 90 mg/dL (ref 0–99)
NonHDL: 108.67
Total CHOL/HDL Ratio: 3
Triglycerides: 92 mg/dL (ref 0.0–149.0)
VLDL: 18.4 mg/dL (ref 0.0–40.0)

## 2020-12-12 LAB — BASIC METABOLIC PANEL
BUN: 15 mg/dL (ref 6–23)
CO2: 29 mEq/L (ref 19–32)
Calcium: 9.4 mg/dL (ref 8.4–10.5)
Chloride: 104 mEq/L (ref 96–112)
Creatinine, Ser: 1.14 mg/dL (ref 0.40–1.50)
GFR: 77.33 mL/min (ref >60.00)
Glucose, Bld: 92 mg/dL (ref 70–99)
Potassium: 4.5 mEq/L (ref 3.5–5.1)
Sodium: 141 mEq/L (ref 135–145)

## 2020-12-12 LAB — HEMOGLOBIN A1C: Hgb A1c MFr Bld: 6.2 % (ref 4.6–6.5)

## 2020-12-12 LAB — PSA: PSA: 0.41 ng/mL (ref 0.10–4.00)

## 2020-12-12 LAB — TSH: TSH: 0.48 u[IU]/mL (ref 0.35–5.50)

## 2020-12-12 NOTE — Progress Notes (Signed)
Established Patient Office Visit  Subjective:  Patient ID: Douglas Klein, male    DOB: 1975-12-05  Age: 45 y.o. MRN: [id number]  CC:  Chief Complaint  Patient presents with   Annual Exam    HPI Douglas Klein presents for physical exam.  Generally very healthy.  Takes no regular medications.  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  Same day trips.  Usually works about 60 hours/week.  Somewhat inconsistent with exercise.    Health maintenance reviewed  -Declines flu vaccine -Previous hepatitis C screen negative -Colonoscopy due 2/32 -Tetanus due 2024  Family history-biologic father has history of prostate cancer.  Mother and father have history of hypertension.  No family history of colon cancer.  No history of CAD.  He had several grandparents with type 2 diabetes  Social history-he is married and this is his second marriage.  He has 3 children from first marriage and 2 from current.  Works for [employer] as a Naval architect.  Non-smoker.    Past Medical History:  Diagnosis Date   Allergy    seasonal allergies   GERD (gastroesophageal reflux disease)    with certain foods-OTC PRN    Past Surgical History:  Procedure Laterality Date   WISDOM TOOTH EXTRACTION      Family History  Problem Relation Age of Onset   Diabetes Other    Hypertension Mother    Hypertension Father    Cancer Father 86       prostate cancer   Diabetes Maternal Grandfather    Diabetes Paternal Grandmother    Cancer Paternal Grandfather        gastric cancer    Social History   Socioeconomic History   Marital status: Legally Separated    Spouse name: Not on file   Number of children: Not on file   Years of education: Not on file   Highest education level: Not on file  Occupational History   Not on file  Tobacco Use   Smoking status: Every Day    Types: Cigars   Smokeless tobacco: Never   Tobacco comments:    2 x per month  Vaping Use   Vaping Use: Never used  Substance and Sexual  Activity   Alcohol use: Yes    Alcohol/week: 1.0 standard drink    Types: 1 Standard drinks or equivalent per week   Drug use: No   Sexual activity: Not on file  Other Topics Concern   Not on file  Social History Narrative   Not on file   Social Determinants of Health   Financial Resource Strain: Not on file  Food Insecurity: Not on file  Transportation Needs: Not on file  Physical Activity: Not on file  Stress: Not on file  Social Connections: Not on file  Intimate Partner Violence: Not on file    Outpatient Medications Prior to Visit  Medication Sig Dispense Refill   ibuprofen (ADVIL,MOTRIN) 200 MG tablet Take 800 mg by mouth every 6 (six) hours as needed for moderate pain. Reported on 07/25/2015     tadalafil (CIALIS) 20 MG tablet Take 0.5-1 tablets (10-20 mg total) by mouth every other day as needed for erectile dysfunction. 10 tablet 11   predniSONE (DELTASONE) 10 MG tablet Taper as follows over 10 days:  6-6-4-4-3-3-2-2-1-1 36 tablet 0   No facility-administered medications prior to visit.    No Known Allergies  ROS Review of Systems  Constitutional:  Negative for activity change, appetite change, fatigue  and fever.  HENT:  Negative for congestion, ear pain and trouble swallowing.   Eyes:  Negative for pain and visual disturbance.  Respiratory:  Negative for cough, shortness of breath and wheezing.   Cardiovascular:  Negative for chest pain and palpitations.  Gastrointestinal:  Negative for abdominal distention, abdominal pain, blood in stool, constipation, diarrhea, nausea, rectal pain and vomiting.  Endocrine: Negative for polydipsia and polyuria.  Genitourinary:  Negative for dysuria, hematuria and testicular pain.  Musculoskeletal:  Negative for arthralgias and joint swelling.  Skin:  Negative for rash.  Neurological:  Negative for dizziness, syncope and headaches.  Hematological:  Negative for adenopathy.  Psychiatric/Behavioral:  Negative for confusion and  dysphoric mood.      Objective:    Physical Exam Constitutional:      General: He is not in acute distress.    Appearance: He is well-developed.  HENT:     Head: Normocephalic and atraumatic.     Right Ear: External ear normal.     Left Ear: External ear normal.  Eyes:     Conjunctiva/sclera: Conjunctivae normal.     Pupils: Pupils are equal, round, and reactive to light.  Neck:     Thyroid: No thyromegaly.  Cardiovascular:     Rate and Rhythm: Normal rate and regular rhythm.     Heart sounds: Normal heart sounds. No murmur heard. Pulmonary:     Effort: No respiratory distress.     Breath sounds: No wheezing or rales.  Abdominal:     General: Bowel sounds are normal. There is no distension.     Palpations: Abdomen is soft. There is no mass.     Tenderness: There is no abdominal tenderness. There is no guarding or rebound.  Musculoskeletal:     Cervical back: Normal range of motion and neck supple.     Right lower leg: No edema.     Left lower leg: No edema.  Lymphadenopathy:     Cervical: No cervical adenopathy.  Skin:    Findings: No rash.  Neurological:     Mental Status: He is alert and oriented to person, place, and time.     Cranial Nerves: No cranial nerve deficit.    BP 116/80 (BP Location: Left Arm, Patient Position: Sitting, Cuff Size: Normal)   Pulse 78   Temp 98 F (36.7 C) (Oral)   Ht 5\' 8"  (1.727 m)   Wt 205 lb 12.8 oz (93.4 kg)   SpO2 94%   BMI 31.29 kg/m  Wt Readings from Last 3 Encounters:  12/12/20 205 lb 12.8 oz (93.4 kg)  09/20/20 199 lb (90.3 kg)  03/02/20 205 lb (93 kg)     Health Maintenance Due  Topic Date Due   Pneumococcal Vaccine 6719-46 Years old (1 - PCV) Never done   HIV Screening  Never done   COVID-19 Vaccine (3 - Booster for Pfizer series) 08/14/2019    There are no preventive care reminders to display for this patient.  Lab Results  Component Value Date   TSH 0.64 12/07/2019   Lab Results  Component Value Date    WBC 4.4 12/07/2019   HGB 14.4 12/07/2019   HCT 42.7 12/07/2019   MCV 82.0 12/07/2019   PLT 335 12/07/2019   Lab Results  Component Value Date   NA 142 12/07/2019   K 4.6 12/07/2019   CO2 27 12/07/2019   GLUCOSE 94 12/07/2019   BUN 17 12/07/2019   CREATININE 1.17 12/07/2019   BILITOT 0.6 12/07/2019  ALKPHOS 64 11/05/2018   AST 22 12/07/2019   ALT 27 12/07/2019   PROT 7.2 12/07/2019   ALBUMIN 4.9 11/05/2018   CALCIUM 9.7 12/07/2019   ANIONGAP 11 11/16/2013   GFR 76.62 11/05/2018   Lab Results  Component Value Date   CHOL 153 12/07/2019   Lab Results  Component Value Date   HDL 50 12/07/2019   Lab Results  Component Value Date   LDLCALC 85 12/07/2019   Lab Results  Component Value Date   TRIG 86 12/07/2019   Lab Results  Component Value Date   CHOLHDL 3.1 12/07/2019   Lab Results  Component Value Date   HGBA1C 6.1 11/05/2018      Assessment & Plan:   Problem List Items Addressed This Visit   None Visit Diagnoses     Physical exam    -  Primary   Relevant Orders   Basic metabolic panel   Lipid panel   CBC with Differential/Platelet   TSH   Hepatic function panel   PSA   Hemoglobin A1c     Healthy 45 year old male.  Given family history of type 2 diabetes we will check A1c.  We will go ahead and check PSA with family history of prostate cancer in his father. Offered flu vaccine but declines  No orders of the defined types were placed in this encounter.   Follow-up: No follow-ups on file.    Carolann Littler, MD

## 2021-02-07 ENCOUNTER — Telehealth: Payer: Self-pay | Admitting: Family Medicine

## 2021-02-07 NOTE — Telephone Encounter (Signed)
Please advise. Would you like to see him or should we refer him out since this is an ongoing issue?

## 2021-02-07 NOTE — Telephone Encounter (Signed)
Patient called in stating that he is still having back problems from years ago. Patient doesn't know if Dr.Burchette needs to see him in office or not. Patient states that if Dr.Burchette does need to see him to let him know because he have to have to contact insurance carrier to get it coded correct for imaging.  Patient could be contacted at [phone].  Please advise.

## 2021-02-08 NOTE — Telephone Encounter (Signed)
Spoke with the patient. He is aware of Dr. Lucie Leather message. In office appointment has been scheduled.

## 2021-02-13 ENCOUNTER — Ambulatory Visit (INDEPENDENT_AMBULATORY_CARE_PROVIDER_SITE_OTHER): Payer: BC Managed Care – PPO

## 2021-02-13 ENCOUNTER — Ambulatory Visit: Payer: BC Managed Care – PPO | Admitting: Family Medicine

## 2021-02-13 ENCOUNTER — Other Ambulatory Visit: Payer: Self-pay

## 2021-02-13 VITALS — BP 110/72 | HR 85 | Temp 98.0°F | Wt 203.0 lb

## 2021-02-13 DIAGNOSIS — G8929 Other chronic pain: Secondary | ICD-10-CM | POA: Diagnosis not present

## 2021-02-13 DIAGNOSIS — M545 Low back pain, unspecified: Secondary | ICD-10-CM | POA: Diagnosis not present

## 2021-02-13 MED ORDER — METHOCARBAMOL 500 MG PO TABS: 500.0000 mg | ORAL_TABLET | Freq: Three times a day (TID) | ORAL | 1 refills | Status: DC | PRN

## 2021-02-13 MED ORDER — NAPROXEN 500 MG PO TABS: 500.0000 mg | ORAL_TABLET | Freq: Two times a day (BID) | ORAL | 1 refills | Status: DC

## 2021-02-13 NOTE — Progress Notes (Signed)
Established Patient Office Visit  Subjective:  Patient ID: Douglas Klein, male    DOB: [birth year]  Age: 46 y.o. MRN: [id number]  CC:  Chief Complaint  Patient presents with   Follow-up    Back pain    HPI Douglas Klein presents for evaluation of chronic intermittent lumbar pain.  He states he had pain at least going back to 2016.  Was seen in September for flareup involving right lumbar area.  He states more often he has pain left lower lumbar region.  Occasional has left thigh numbness and radiation into the thigh with flareups.  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  He has seen chiropractor occasionally in the past with temporary relief.  Denies any current lower extremity numbness or weakness.  No urine or stool incontinence.  He has taken Motrin and Tylenol in the past with minimal relief.  has also tried heat without relief.  He had called recently requesting some imaging.  No recent x-rays of any type.  Has never had MRI.  Past Medical History:  Diagnosis Date   Allergy    seasonal allergies   GERD (gastroesophageal reflux disease)    with certain foods-OTC PRN    Past Surgical History:  Procedure Laterality Date   WISDOM TOOTH EXTRACTION      Family History  Problem Relation Age of Onset   Diabetes Other    Hypertension Mother    Hypertension Father    Cancer Father 74       prostate cancer   Diabetes Maternal Grandfather    Diabetes Paternal Grandmother    Cancer Paternal Grandfather        gastric cancer    Social History   Socioeconomic History   Marital status: Legally Separated    Spouse name: Not on file   Number of children: Not on file   Years of education: Not on file   Highest education level: Not on file  Occupational History   Not on file  Tobacco Use   Smoking status: Every Day    Types: Cigars   Smokeless tobacco: Never   Tobacco comments:    2 x per month  Vaping Use   Vaping  Use: Never used  Substance and Sexual Activity   Alcohol use: Yes    Alcohol/week: 1.0 standard drink    Types: 1 Standard drinks or equivalent per week   Drug use: No   Sexual activity: Not on file  Other Topics Concern   Not on file  Social History Narrative   Not on file   Social Determinants of Health   Financial Resource Strain: Not on file  Food Insecurity: Not on file  Transportation Needs: Not on file  Physical Activity: Not on file  Stress: Not on file  Social Connections: Not on file  Intimate Partner Violence: Not on file    Outpatient Medications Prior to Visit  Medication Sig Dispense Refill   ibuprofen (ADVIL,MOTRIN) 200 MG tablet Take 800 mg by mouth every 6 (six) hours as needed for moderate pain. Reported on 07/25/2015     tadalafil (CIALIS) 20 MG tablet Take 0.5-1 tablets (10-20 mg total) by mouth every other day as needed for erectile dysfunction. 10 tablet 11   No facility-administered medications prior to visit.    No Known Allergies  ROS Review of Systems  Constitutional:  Negative for activity change, appetite change and fever.  Respiratory:  Negative for cough and shortness of breath.   Cardiovascular:  Negative for chest pain and leg swelling.  Gastrointestinal:  Negative for abdominal pain and vomiting.  Endocrine: Negative for polydipsia and polyuria.  Genitourinary:  Negative for dysuria, flank pain and hematuria.  Musculoskeletal:  Positive for back pain. Negative for joint swelling.  Neurological:  Negative for weakness and numbness.     Objective:    Physical Exam Constitutional:      General: He is not in acute distress.    Appearance: He is well-developed.  Neck:     Thyroid: No thyromegaly.  Cardiovascular:     Rate and Rhythm: Normal rate and regular rhythm.     Heart sounds: Normal heart sounds. No murmur heard. Pulmonary:     Effort: Pulmonary effort is normal. No respiratory distress.     Breath sounds: Normal breath  sounds. No wheezing or rales.  Musculoskeletal:     Comments: No spinal tenderness.  Straight leg raise is negative.  Good range of motion both hips without pain  Skin:    Findings: No rash.  Neurological:     Mental Status: He is alert and oriented to person, place, and time.     Cranial Nerves: No cranial nerve deficit.     Deep Tendon Reflexes: Reflexes are normal and symmetric.    BP 110/72 (BP Location: Left Arm, Patient Position: Sitting, Cuff Size: Normal)    Pulse 85    Temp 98 F (36.7 C) (Oral)    Wt 203 lb (92.1 kg)    SpO2 97%    BMI 30.87 kg/m  Wt Readings from Last 3 Encounters:  02/13/21 203 lb (92.1 kg)  12/12/20 205 lb 12.8 oz (93.4 kg)  09/20/20 199 lb (90.3 kg)     Health Maintenance Due  Topic Date Due   HIV Screening  Never done   COVID-19 Vaccine (3 - Booster for Pfizer series) 08/14/2019    There are no preventive care reminders to display for this patient.  Lab Results  Component Value Date   TSH 0.48 12/12/2020   Lab Results  Component Value Date   WBC 4.9 12/12/2020   HGB 14.1 12/12/2020   HCT 42.7 12/12/2020   MCV 82.4 12/12/2020   PLT 336.0 12/12/2020   Lab Results  Component Value Date   NA 141 12/12/2020   K 4.5 12/12/2020   CO2 29 12/12/2020   GLUCOSE 92 12/12/2020   BUN 15 12/12/2020   CREATININE 1.14 12/12/2020   BILITOT 0.6 12/12/2020   ALKPHOS 50 12/12/2020   AST 28 12/12/2020   ALT 30 12/12/2020   PROT 6.8 12/12/2020   ALBUMIN 4.5 12/12/2020   CALCIUM 9.4 12/12/2020   ANIONGAP 11 11/16/2013   GFR 77.33 12/12/2020   Lab Results  Component Value Date   CHOL 156 12/12/2020   Lab Results  Component Value Date   HDL 47.60 12/12/2020   Lab Results  Component Value Date   LDLCALC 90 12/12/2020   Lab Results  Component Value Date   TRIG 92.0 12/12/2020   Lab Results  Component Value Date   CHOLHDL 3 12/12/2020   Lab Results  Component Value Date   HGBA1C 6.2 12/12/2020      Assessment & Plan:   Problem  List Items Addressed This Visit   None Visit Diagnoses     Chronic bilateral low back pain without sciatica    -  Primary   Relevant Medications   methocarbamol (  ROBAXIN) 500 MG tablet   naproxen (NAPROSYN) 500 MG tablet   Other Relevant Orders   DG Lumbar Spine Complete     Patient relates chronic bilateral lower lumbar pain for at least 7 years.  He has flareups which are intermittent which are usually left-sided.  Nonfocal neuro exam at this time.  We suggested the following  -Trial of naproxen and Robaxin with future flareup.  He is aware of potential sedation with Robaxin. -Continue back stretching exercises frequently -We did discuss possible trial of physical therapy but his schedule be challenging with his current workload -Plain plain x-rays lumbar spine given duration of symptoms. -Reviewed things to watch for such as progressive numbness, weakness, or pain.  Meds ordered this encounter  Medications   methocarbamol (ROBAXIN) 500 MG tablet    Sig: Take 1 tablet (500 mg total) by mouth every 8 (eight) hours as needed for muscle spasms.    Dispense:  30 tablet    Refill:  1   naproxen (NAPROSYN) 500 MG tablet    Sig: Take 1 tablet (500 mg total) by mouth 2 (two) times daily with a meal.    Dispense:  30 tablet    Refill:  1    Follow-up: No follow-ups on file.    Carolann Littler, MD

## 2021-11-08 ENCOUNTER — Ambulatory Visit
Admission: EM | Admit: 2021-11-08 | Discharge: 2021-11-08 | Disposition: A | Discharge status: Home / Self Care | Payer: BC Managed Care – PPO | Attending: Urgent Care | Admitting: Urgent Care

## 2021-11-08 DIAGNOSIS — J329 Chronic sinusitis, unspecified: Secondary | ICD-10-CM | POA: Diagnosis not present

## 2021-11-08 DIAGNOSIS — B9789 Other viral agents as the cause of diseases classified elsewhere: Secondary | ICD-10-CM | POA: Diagnosis not present

## 2021-11-08 MED ORDER — PREDNISONE 10 MG (21) PO TBPK: ORAL_TABLET | Freq: Every day | ORAL | 0 refills | Status: DC

## 2021-11-08 NOTE — ED Provider Notes (Signed)
Renaldo Fiddler    CSN: [id number] Arrival date & time: 11/08/21  1818      History   Chief Complaint Chief Complaint  Patient presents with   Facial Pain    HPI Douglas Klein is a 46 y.o. male.   HPI  Presents to UC with complaint of symptoms starting 2 days ago.  He endorses sinus pressure and "nasal burning".  Eyes fever, myalgias, chills, GI symptoms.  Past Medical History:  Diagnosis Date   Allergy    seasonal allergies   GERD (gastroesophageal reflux disease)    with certain foods-OTC PRN    Patient Active Problem List   Diagnosis Date Noted   Dizziness and giddiness 07/25/2015    Past Surgical History:  Procedure Laterality Date   WISDOM TOOTH EXTRACTION         Home Medications    Prior to Admission medications   Medication Sig Start Date End Date Taking? Authorizing Provider  ibuprofen (ADVIL,MOTRIN) 200 MG tablet Take 800 mg by mouth every 6 (six) hours as needed for moderate pain. Reported on 07/25/2015    [provider]  methocarbamol (ROBAXIN) 500 MG tablet Take 1 tablet (500 mg total) by mouth every 8 (eight) hours as needed for muscle spasms. 02/13/21   Burchette, Elberta Fortis, MD  naproxen (NAPROSYN) 500 MG tablet Take 1 tablet (500 mg total) by mouth 2 (two) times daily with a meal. 02/13/21   Burchette, Elberta Fortis, MD  tadalafil (CIALIS) 20 MG tablet Take 0.5-1 tablets (10-20 mg total) by mouth every other day as needed for erectile dysfunction. 09/20/20   Burchette, Elberta Fortis, MD    Family History Family History  Problem Relation Age of Onset   Diabetes Other    Hypertension Mother    Hypertension Father    Cancer Father 29       prostate cancer   Diabetes Maternal Grandfather    Diabetes Paternal Grandmother    Cancer Paternal Grandfather        gastric cancer    Social History Social History   Tobacco Use   Smoking status: Every Day    Types: Cigars   Smokeless tobacco: Never   Tobacco comments:    2 x per month   Vaping Use   Vaping Use: Never used  Substance Use Topics   Alcohol use: Yes    Alcohol/week: 1.0 standard drink of alcohol    Types: 1 Standard drinks or equivalent per week   Drug use: No     Allergies   Patient has no known allergies.   Review of Systems Review of Systems   Physical Exam Triage Vital Signs ED Triage Vitals [11/08/21 1831]  Enc Vitals Group     BP (!) 151/94     Pulse Rate 60     Resp 18     Temp 97.9 F (36.6 C)     Temp src      SpO2 98 %     Weight      Height      Head Circumference      Peak Flow      Pain Score 5     Pain Loc      Pain Edu?      Excl. in GC?    No data found.  Updated Vital Signs BP (!) 151/94   Pulse 60   Temp 97.9 F (36.6 C)   Resp 18   SpO2 98%   Visual Acuity  Right Eye Distance:   Left Eye Distance:   Bilateral Distance:    Right Eye Near:   Left Eye Near:    Bilateral Near:     Physical Exam Vitals reviewed.  Constitutional:      Appearance: Normal appearance.  HENT:     Nose:     Right Sinus: No maxillary sinus tenderness or frontal sinus tenderness.     Left Sinus: No maxillary sinus tenderness or frontal sinus tenderness.  Skin:    General: Skin is warm and dry.  Neurological:     General: No focal deficit present.     Mental Status: He is alert and oriented to person, place, and time.  Psychiatric:        Mood and Affect: Mood normal.        Behavior: Behavior normal.      UC Treatments / Results  Labs (all labs ordered are listed, but only abnormal results are displayed) Labs Reviewed - No data to display  EKG   Radiology No results found.  Procedures Procedures (including critical care time)  Medications Ordered in UC Medications - No data to display  Initial Impression / Assessment and Plan / UC Course  I have reviewed the triage vital signs and the nursing notes.  Pertinent labs & imaging results that were available during my care of the patient were reviewed by  me and considered in my medical decision making (see chart for details).   Likely viral sinusitis given duration of symptoms.  Will prescribe corticosteroid to treat based on the patient's description of his sinus pain.  He states he works nights and can start dosing tonight before work.   Final Clinical Impressions(s) / UC Diagnoses   Final diagnoses:  None   Discharge Instructions   None    ED Prescriptions   None    PDMP not reviewed this encounter.   Rose Phi, [place] 11/08/21 1836

## 2021-11-08 NOTE — Discharge Instructions (Addendum)
Follow up here or with your primary care provider if your symptoms are worsening or not improving with treatment.     

## 2021-11-08 NOTE — ED Triage Notes (Signed)
Pt. Presents to UC w/ c/o sinus pressure and feeling "nasal burning" that started 2 days ago. Pt. Expresses concern for sinus pressure.

## 2021-11-10 ENCOUNTER — Telehealth: Payer: Self-pay

## 2021-11-10 NOTE — Telephone Encounter (Signed)
Caller states he is requesting a nasal spray. He was prescribed Prednisone for pressure and congestion. He wants to know if the spray will help clear it up. Seen at [facility] yesterday and given Rx prednisone 10mg  and dose pack titration dose for ? sinus inflammation for 6 days. No antibiotic Rx. Monday s/s started. Cough started yesterday. No fever.  11/09/2021 2:45:24 PM SEE PCP WITHIN 3 DAYS Alvis Lemmings, RN, Marcie Bal  Comments User: Manning Charity, RN Date/Time Eilene Ghazi Time): 11/09/2021 2:39:56 PM allergy meds for lawnwork. Did nasal irrigation done earlier in week.  User: Manning Charity, RN Date/Time Eilene Ghazi Time): 11/09/2021 2:50:26 PM Advised saline prior to any nose spray to help it work. May do one of the decongestant methods whether sudafed or Afrin,neosynephrine but not both nasal and oral. Check to see if UC called RX for antibiotics for sinus infection but if needs Rx,may need video visit or in person. Has not tested for covid and advised he may want to do this to know since coughing  11/10/21 1029 - Pt states okay. Going to pick up recommended sprays. Pt advised to call to schedule appt with any provider after prednisone taper IF he doesn't feel like he's getting better. Pt verb understanding.

## 2022-03-02 IMAGING — DX DG LUMBAR SPINE COMPLETE 4+V
5 series · 5 of 5 positions shown · non-contrast
Comparison: None.

CLINICAL DATA: Intermittent low back pain for several years.

EXAM:
LUMBAR SPINE - COMPLETE 4+ VIEW

[lumbar spine ap]
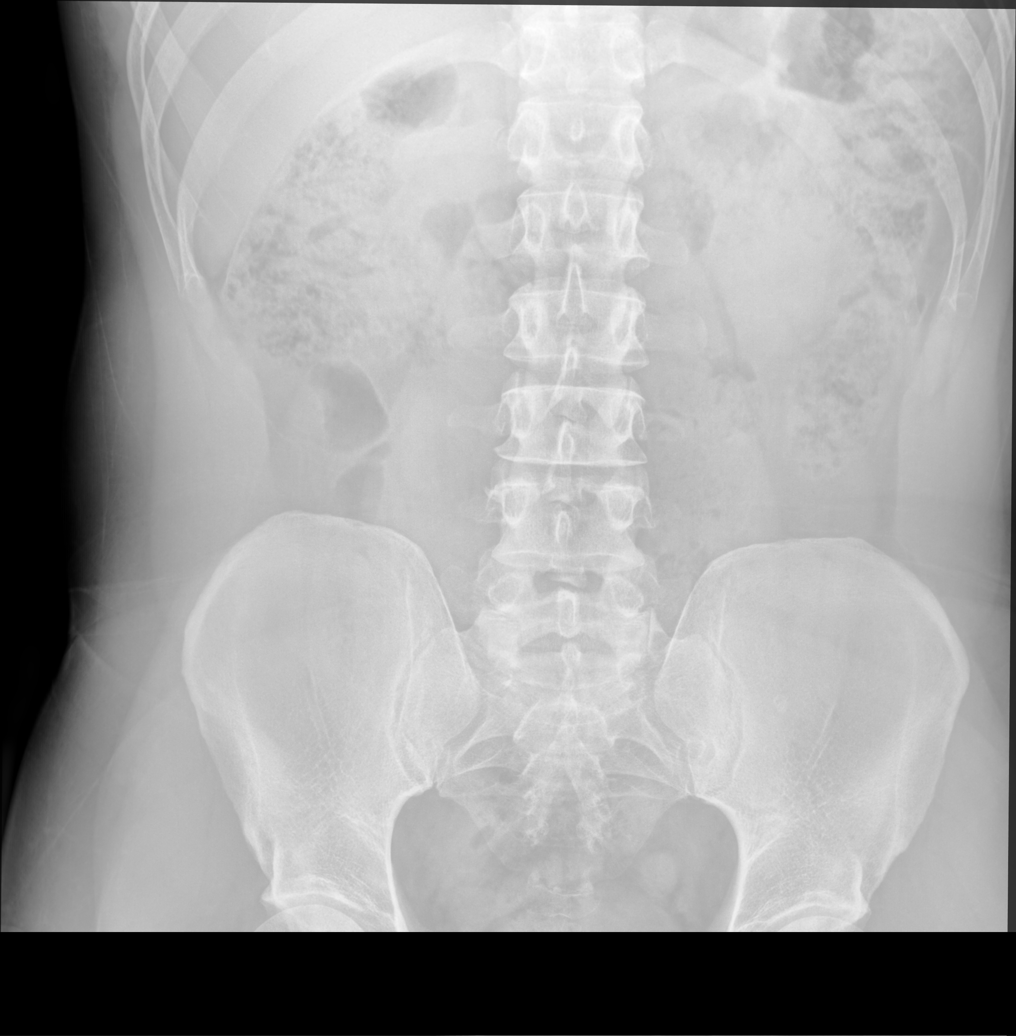

[lumbar spine oblique (1 of 2)]
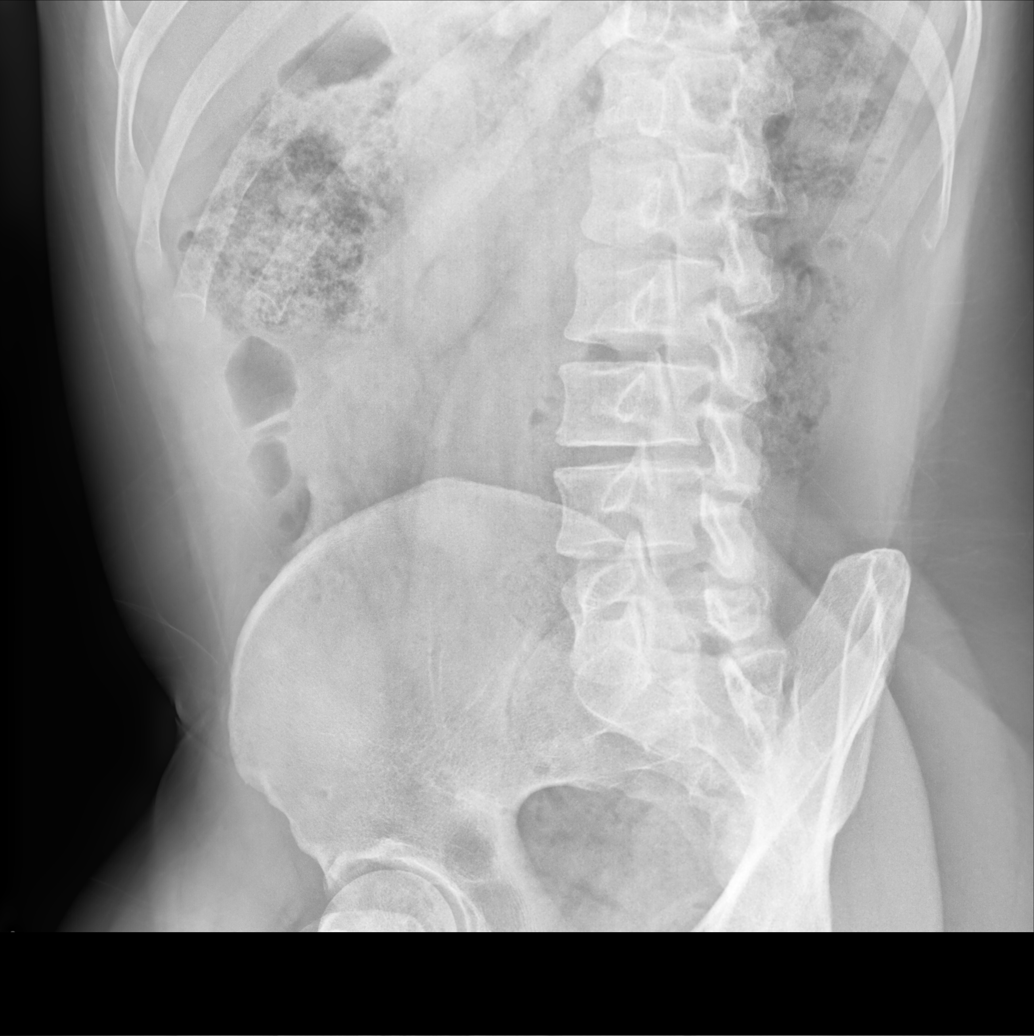

[lumbar spine oblique (2 of 2)]
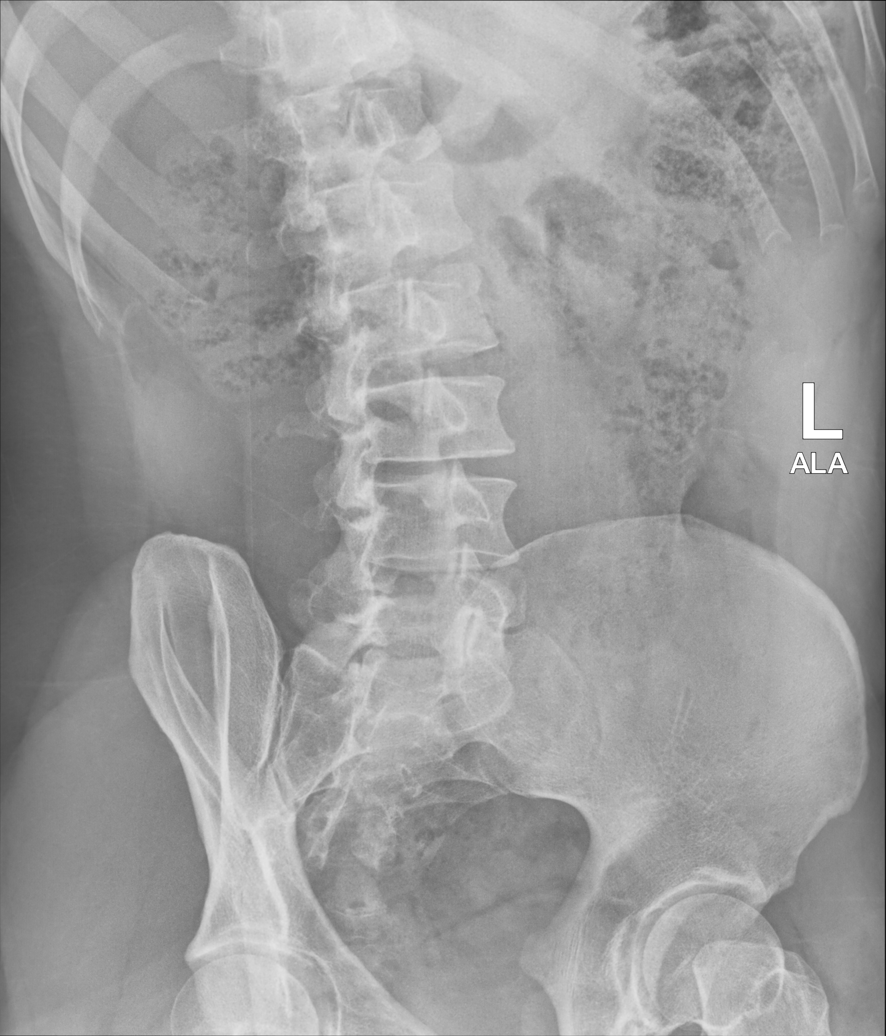

[lumbar spine lat (1 of 2)]
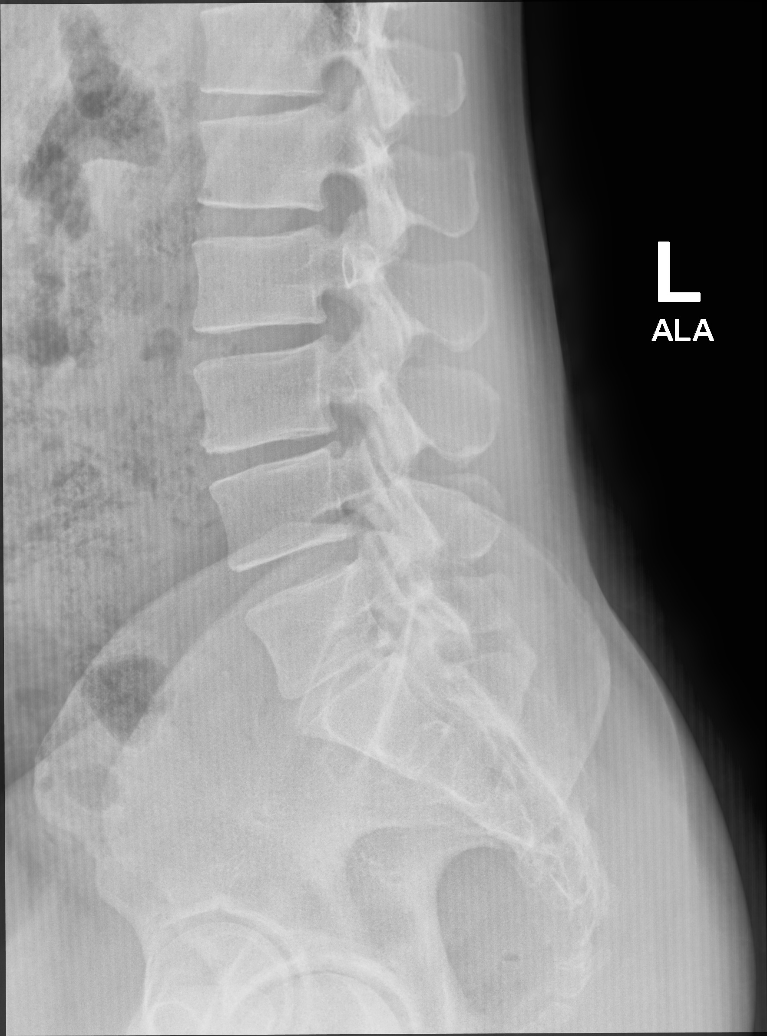

[lumbar spine lat (2 of 2)]
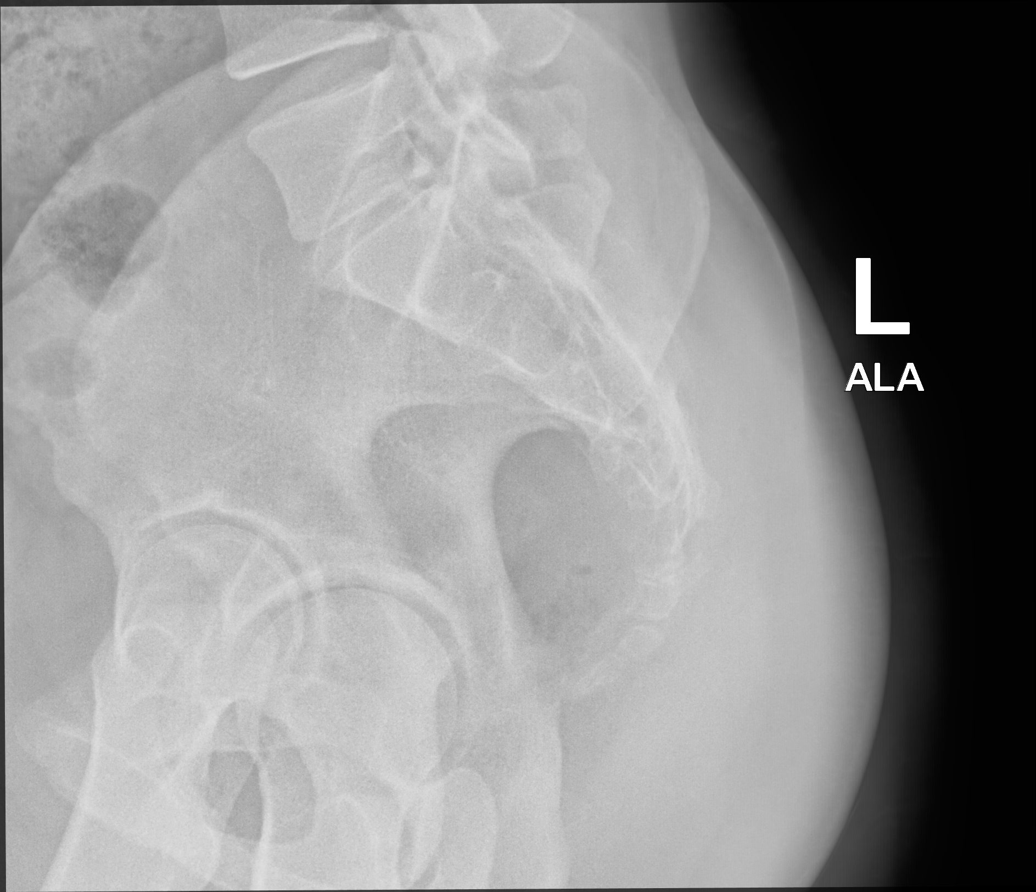

[5 of 5 positions shown; findings below may reference images not displayed]

FINDINGS: There is no evidence of lumbar spine fracture. Alignment is normal.
Intervertebral disc spaces are maintained.
IMPRESSION: Negative.

## 2022-04-07 ENCOUNTER — Other Ambulatory Visit: Payer: Self-pay | Admitting: Family Medicine

## 2023-02-04 ENCOUNTER — Ambulatory Visit (INDEPENDENT_AMBULATORY_CARE_PROVIDER_SITE_OTHER): Payer: BC Managed Care – PPO | Admitting: Family Medicine

## 2023-02-04 ENCOUNTER — Encounter: Payer: Self-pay | Admitting: Family Medicine

## 2023-02-04 VITALS — BP 120/80 | HR 70 | Temp 97.9°F | Wt 212.3 lb

## 2023-02-04 DIAGNOSIS — Z0000 Encounter for general adult medical examination without abnormal findings: Secondary | ICD-10-CM

## 2023-02-04 DIAGNOSIS — Z23 Encounter for immunization: Secondary | ICD-10-CM

## 2023-02-04 LAB — HEMOGLOBIN A1C: Hgb A1c MFr Bld: 6.3 % (ref 4.6–6.5)

## 2023-02-04 LAB — CBC WITH DIFFERENTIAL/PLATELET
Basophils Absolute: 0.1 10*3/uL (ref 0.0–0.1)
Basophils Relative: 1.7 % (ref 0.0–3.0)
Eosinophils Absolute: 0.1 10*3/uL (ref 0.0–0.7)
Eosinophils Relative: 1.6 % (ref 0.0–5.0)
HCT: 45.2 % (ref 39.0–52.0)
Hemoglobin: 14.8 g/dL (ref 13.0–17.0)
Lymphocytes Relative: 33.8 % (ref 12.0–46.0)
Lymphs Abs: 1.5 10*3/uL (ref 0.7–4.0)
MCHC: 32.8 g/dL (ref 30.0–36.0)
MCV: 85.7 fL (ref 78.0–100.0)
Monocytes Absolute: 0.4 10*3/uL (ref 0.1–1.0)
Monocytes Relative: 10 % (ref 3.0–12.0)
Neutro Abs: 2.4 10*3/uL (ref 1.4–7.7)
Neutrophils Relative %: 52.9 % (ref 43.0–77.0)
Platelets: 334 10*3/uL (ref 150.0–400.0)
RBC: 5.27 Mil/uL (ref 4.22–5.81)
RDW: 14.3 % (ref 11.5–15.5)
WBC: 4.5 10*3/uL (ref 4.0–10.5)

## 2023-02-04 LAB — BASIC METABOLIC PANEL
BUN: 15 mg/dL (ref 6–23)
CO2: 31 meq/L (ref 19–32)
Calcium: 9.8 mg/dL (ref 8.4–10.5)
Chloride: 102 meq/L (ref 96–112)
Creatinine, Ser: 1.22 mg/dL (ref 0.40–1.50)
GFR: 70.22 mL/min (ref >60.00)
Glucose, Bld: 104 mg/dL — ABNORMAL HIGH (ref 70–99)
Potassium: 5.1 meq/L (ref 3.5–5.1)
Sodium: 140 meq/L (ref 135–145)

## 2023-02-04 LAB — HEPATIC FUNCTION PANEL
ALT: 32 U/L (ref 0–53)
AST: 22 U/L (ref 0–37)
Albumin: 4.8 g/dL (ref 3.5–5.2)
Alkaline Phosphatase: 53 U/L (ref 39–117)
Bilirubin, Direct: 0.1 mg/dL (ref 0.0–0.3)
Total Bilirubin: 0.7 mg/dL (ref 0.2–1.2)
Total Protein: 7.2 g/dL (ref 6.0–8.3)

## 2023-02-04 LAB — LIPID PANEL
Cholesterol: 185 mg/dL (ref 0–200)
HDL: 57.6 mg/dL (ref >39.00)
LDL Cholesterol: 114 mg/dL — ABNORMAL HIGH (ref 0–99)
NonHDL: 127.64
Total CHOL/HDL Ratio: 3
Triglycerides: 67 mg/dL (ref 0.0–149.0)
VLDL: 13.4 mg/dL (ref 0.0–40.0)

## 2023-02-04 LAB — PSA: PSA: 0.51 ng/mL (ref 0.10–4.00)

## 2023-02-04 NOTE — Progress Notes (Signed)
Established Patient Office Visit  Subjective   Patient ID: Douglas Klein, male    DOB: Apr 29, 1975  Age: 48 y.o. MRN: [id number]  Chief Complaint  Patient presents with   Annual Exam    HPI   Freman is seen for physical exam.  Generally healthy.  Takes no regular medications.  He has had prediabetes range blood sugars by previous lab work.  Currently not exercising much.  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  Usually only gets about 4 to 5 hours sleep at night.  Health maintenance reviewed:  Health Maintenance  Topic Date Due   HIV Screening  Never done   DTaP/Tdap/Td (2 - Td or Tdap) 06/13/2022   COVID-19 Vaccine (3 - 2024-25 season) 09/09/2022   INFLUENZA VACCINE  04/08/2023 (Originally 08/09/2022)   Pneumococcal Vaccine 44-76 Years old (1 of 2 - PCV) 02/09/2025 (Originally 10/25/1980)   Colonoscopy  03/02/2030   Hepatitis C Screening  Completed   HPV VACCINES  Aged Out   -Declines flu vaccine -Colonoscopy up-to-date -Needs tetanus and does consent to that  Social history-he is married and has 2 children from this marriage and 3 children from prior marriage.  Still works for [employer] as a Naval architect.  Rare cigar use.  No regular alcohol.  Family history-biologic father has history of prostate cancer.  Mother and father have history of hypertension.  No family history of colon cancer.  No history of CAD.  He had several grandparents with type 2 diabetes   Past Medical History:  Diagnosis Date   Allergy    seasonal allergies   GERD (gastroesophageal reflux disease)    with certain foods-OTC PRN   Past Surgical History:  Procedure Laterality Date   WISDOM TOOTH EXTRACTION      reports that he has been smoking cigars. He has never used smokeless tobacco. He reports current alcohol use of about 1.0 standard drink of alcohol per week. He reports that he does not use drugs. family history includes Cancer in his paternal  grandfather; Cancer (age of onset: 21) in his father; Diabetes in his maternal grandfather, paternal grandmother, and another family member; Hypertension in his father and mother. No Known Allergies   Review of Systems  Constitutional:  Negative for chills, fever, malaise/fatigue and weight loss.  HENT:  Negative for hearing loss.   Eyes:  Negative for blurred vision and double vision.  Respiratory:  Negative for cough and shortness of breath.   Cardiovascular:  Negative for chest pain, palpitations and leg swelling.  Gastrointestinal:  Negative for abdominal pain, blood in stool, constipation and diarrhea.  Genitourinary:  Negative for dysuria.  Skin:  Negative for rash.  Neurological:  Negative for dizziness, speech change, seizures, loss of consciousness and headaches.  Psychiatric/Behavioral:  Negative for depression.       Objective:     BP 120/80 (BP Location: Left Arm, Patient Position: Sitting, Cuff Size: Large)   Pulse 70   Temp 97.9 F (36.6 C) (Oral)   Wt 212 lb 4.8 oz (96.3 kg)   SpO2 97%   BMI 32.28 kg/m  BP Readings from Last 3 Encounters:  02/04/23 120/80  11/08/21 (!) 151/94  02/13/21 110/72   Wt Readings from Last 3 Encounters:  02/04/23 212 lb 4.8 oz (96.3 kg)  02/13/21 203 lb (92.1 kg)  12/12/20 205 lb 12.8 oz (93.4 kg)      Physical Exam Vitals reviewed.  Constitutional:  General: He is not in acute distress.    Appearance: He is well-developed. He is not ill-appearing.  HENT:     Head: Normocephalic and atraumatic.     Right Ear: External ear normal.     Left Ear: External ear normal.  Eyes:     Conjunctiva/sclera: Conjunctivae normal.     Pupils: Pupils are equal, round, and reactive to light.  Neck:     Thyroid: No thyromegaly.  Cardiovascular:     Rate and Rhythm: Normal rate and regular rhythm.     Heart sounds: Normal heart sounds. No murmur heard. Pulmonary:     Effort: No respiratory distress.     Breath sounds: No wheezing  or rales.  Abdominal:     General: Bowel sounds are normal. There is no distension.     Palpations: Abdomen is soft. There is no mass.     Tenderness: There is no abdominal tenderness. There is no guarding or rebound.  Musculoskeletal:     Cervical back: Normal range of motion and neck supple.     Right lower leg: No edema.     Left lower leg: No edema.  Lymphadenopathy:     Cervical: No cervical adenopathy.  Skin:    Findings: No rash.  Neurological:     Mental Status: He is alert and oriented to person, place, and time.     Cranial Nerves: No cranial nerve deficit.      No results found for any visits on 02/04/23.  Last CBC Lab Results  Component Value Date   WBC 4.9 12/12/2020   HGB 14.1 12/12/2020   HCT 42.7 12/12/2020   MCV 82.4 12/12/2020   MCH 27.6 12/07/2019   RDW 13.7 12/12/2020   PLT 336.0 12/12/2020   Last metabolic panel Lab Results  Component Value Date   GLUCOSE 92 12/12/2020   NA 141 12/12/2020   K 4.5 12/12/2020   CL 104 12/12/2020   CO2 29 12/12/2020   BUN 15 12/12/2020   CREATININE 1.14 12/12/2020   GFR 77.33 12/12/2020   CALCIUM 9.4 12/12/2020   PROT 6.8 12/12/2020   ALBUMIN 4.5 12/12/2020   BILITOT 0.6 12/12/2020   ALKPHOS 50 12/12/2020   AST 28 12/12/2020   ALT 30 12/12/2020   ANIONGAP 11 11/16/2013   Last lipids Lab Results  Component Value Date   CHOL 156 12/12/2020   HDL 47.60 12/12/2020   LDLCALC 90 12/12/2020   TRIG 92.0 12/12/2020   CHOLHDL 3 12/12/2020   Last thyroid functions Lab Results  Component Value Date   TSH 0.48 12/12/2020      The 10-year ASCVD risk score (Arnett DK, et al., 2019) is: 4.4%    Assessment & Plan:   Problem List Items Addressed This Visit   None Visit Diagnoses       Physical exam    -  Primary   Relevant Orders   Basic metabolic panel   Lipid panel   Hepatic function panel   CBC with Differential/Platelet   Hemoglobin A1c   PSA     Generally healthy 48 year old black male.   Positive family history of prostate cancer in his father.  He has had prediabetes range blood sugars in the past.  Discussed the following health maintenance items  -Try to establish more consistent sleep with recommended goal of at least 7 to 8 hours at night -Try to establish more consistent exercise -Flu vaccine offered and declined -Tdap given -Obtain follow-up labs as above.  Include A1c with prior elevated blood sugars -Discussed dietary factors with regard to hyperglycemia.  Try to reduce snacking on starchy foods  No follow-ups on file.    Evelena Peat, MD

## 2023-02-07 ENCOUNTER — Telehealth: Payer: Self-pay | Admitting: *Deleted

## 2023-02-07 DIAGNOSIS — R7303 Prediabetes: Secondary | ICD-10-CM

## 2023-02-07 NOTE — Telephone Encounter (Signed)
Copied from CRM [id number]. Topic: General - Other >> Feb 07, 2023  1:38 PM Truddie Crumble wrote: Reason for CRM: patient called stating he would like to be scheduled to see a dietician CB [phone]

## 2023-02-08 NOTE — Telephone Encounter (Signed)
Referral placed and patient informed of the message

## 2023-04-08 ENCOUNTER — Ambulatory Visit
Admission: EM | Admit: 2023-04-08 | Discharge: 2023-04-08 | Disposition: A | Discharge status: Home / Self Care | Attending: Emergency Medicine | Admitting: Emergency Medicine

## 2023-04-08 DIAGNOSIS — J309 Allergic rhinitis, unspecified: Secondary | ICD-10-CM | POA: Diagnosis not present

## 2023-04-08 DIAGNOSIS — J302 Other seasonal allergic rhinitis: Secondary | ICD-10-CM | POA: Diagnosis not present

## 2023-04-08 MED ORDER — PREDNISONE 10 MG (21) PO TBPK: ORAL_TABLET | Freq: Every day | ORAL | 0 refills | Status: DC

## 2023-04-08 NOTE — ED Provider Notes (Signed)
 Renaldo Fiddler    CSN: [id number] Arrival date & time: 04/08/23  1116      History   Chief Complaint Chief Complaint  Patient presents with   Nasal Congestion   Headache    HPI Caedan ROHIL LESCH is a 48 y.o. male.  Patient presents with 3-day history of runny nose, sinus drainage, sinus pain, headache, cough.  He has been treating his symptoms with Zyrtec.  No fever or shortness of breath.  His medical history includes seasonal allergies.  The history is provided by the patient and medical records.    Past Medical History:  Diagnosis Date   Allergy    seasonal allergies   GERD (gastroesophageal reflux disease)    with certain foods-OTC PRN    Patient Active Problem List   Diagnosis Date Noted   Dizziness and giddiness 07/25/2015    Past Surgical History:  Procedure Laterality Date   WISDOM TOOTH EXTRACTION         Home Medications    Prior to Admission medications   Medication Sig Start Date End Date Taking? Authorizing Provider  predniSONE (STERAPRED UNI-PAK 21 TAB) 10 MG (21) TBPK tablet Take by mouth daily. As directed 04/08/23  Yes Mickie Bail, NP    Family History Family History  Problem Relation Age of Onset   Diabetes Other    Hypertension Mother    Hypertension Father    Cancer Father 4       prostate cancer   Diabetes Maternal Grandfather    Diabetes Paternal Grandmother    Cancer Paternal Grandfather        gastric cancer    Social History Social History   Tobacco Use   Smoking status: Some Days    Types: Cigars   Smokeless tobacco: Never   Tobacco comments:    2 x per month  Vaping Use   Vaping status: Never Used  Substance Use Topics   Alcohol use: Yes    Alcohol/week: 1.0 standard drink of alcohol    Types: 1 Standard drinks or equivalent per week    Comment: Ocassionally   Drug use: No     Allergies   Patient has no known allergies.   Review of Systems Review of Systems  Constitutional:  Negative for chills  and fever.  HENT:  Positive for congestion, postnasal drip, rhinorrhea and sinus pain. Negative for ear pain and sore throat.   Respiratory:  Positive for cough. Negative for shortness of breath.      Physical Exam Triage Vital Signs ED Triage Vitals  Encounter Vitals Group     BP      Systolic BP Percentile      Diastolic BP Percentile      Pulse      Resp      Temp      Temp src      SpO2      Weight      Height      Head Circumference      Peak Flow      Pain Score      Pain Loc      Pain Education      Exclude from Growth Chart    No data found.  Updated Vital Signs BP 135/81   Pulse 80   Temp 99.1 F (37.3 C)   Resp 18   SpO2 98%   Visual Acuity Right Eye Distance:   Left Eye Distance:   Bilateral Distance:  Right Eye Near:   Left Eye Near:    Bilateral Near:     Physical Exam Constitutional:      General: He is not in acute distress. HENT:     Right Ear: Tympanic membrane normal.     Left Ear: Tympanic membrane normal.     Nose: Congestion and rhinorrhea present.     Mouth/Throat:     Mouth: Mucous membranes are moist.     Pharynx: Oropharynx is clear.  Cardiovascular:     Rate and Rhythm: Normal rate and regular rhythm.     Heart sounds: Normal heart sounds.  Pulmonary:     Effort: Pulmonary effort is normal. No respiratory distress.     Breath sounds: Normal breath sounds.  Neurological:     Mental Status: He is alert.      UC Treatments / Results  Labs (all labs ordered are listed, but only abnormal results are displayed) Labs Reviewed - No data to display  EKG   Radiology No results found.  Procedures Procedures (including critical care time)  Medications Ordered in UC Medications - No data to display  Initial Impression / Assessment and Plan / UC Course  I have reviewed the triage vital signs and the nursing notes.  Pertinent labs & imaging results that were available during my care of the patient were reviewed by me  and considered in my medical decision making (see chart for details).    Seasonal allergies, allergic sinusitis.  Patient has been symptomatic for 3 days.  Lungs are clear and O2 sat is 98% on room air.  Treating today with prednisone taper.  Instructed patient to continue her symptomatic management.  Education provided on allergic rhinitis.  Instructed him to follow-up with his PCP if he is not improving.  He agrees to plan of care.  Final Clinical Impressions(s) / UC Diagnoses   Final diagnoses:  Seasonal allergies  Allergic sinusitis     Discharge Instructions      Take the prednisone as directed.  Follow-up with your primary care provider if your symptoms are not improving.      ED Prescriptions     Medication Sig Dispense Auth. Provider   predniSONE (STERAPRED UNI-PAK 21 TAB) 10 MG (21) TBPK tablet Take by mouth daily. As directed 21 tablet Mickie Bail, NP      PDMP not reviewed this encounter.   Mickie Bail, NP 04/08/23 1154

## 2023-04-08 NOTE — ED Triage Notes (Signed)
 Patient to Urgent Care with complaints of headaches/ "burning" and dry sinus pain/ drainage and dry cough  Reports symptoms started on Friday.  Meds: zyrtec.

## 2023-04-08 NOTE — Discharge Instructions (Addendum)
Take the prednisone as directed.  Follow up with your primary care provider if your symptoms are not improving.    

## 2023-08-05 ENCOUNTER — Ambulatory Visit: Payer: BC Managed Care – PPO | Admitting: Family Medicine

## 2023-08-05 VITALS — BP 132/80 | HR 54 | Temp 97.8°F | Wt 194.2 lb

## 2023-08-05 DIAGNOSIS — R7303 Prediabetes: Secondary | ICD-10-CM | POA: Diagnosis not present

## 2023-08-05 LAB — POCT GLYCOSYLATED HEMOGLOBIN (HGB A1C): Hemoglobin A1C: 5.8 % — AB (ref 4.0–5.6)

## 2023-08-05 NOTE — Progress Notes (Signed)
 Established Patient Office Visit  Subjective   Patient ID: Douglas Klein, male    DOB: [birth year]  Age: 48 y.o. MRN: [id number]  No chief complaint on file.   HPI   Douglas Klein is seen today for follow-up regarding prediabetes range blood sugars.  He was here for physical in the winter.  A1c at [place] 6.3%.  He has made some positive changes with increasing exercise and has greatly eliminated high glycemic foods.  For example, he was eating potato chips for snacks and has replaced that with raw vegetables.  His weight is down 18 pounds.  Feels much better overall.  No polyuria or polydipsia.  Exercising fairly regularly.  Works as a [employer]  Past Medical History:  Diagnosis Date   Allergy    seasonal allergies   GERD (gastroesophageal reflux disease)    with certain foods-OTC PRN   Past Surgical History:  Procedure Laterality Date   WISDOM TOOTH EXTRACTION      reports that he has been smoking cigars. He has never used smokeless tobacco. He reports current alcohol use of about 1.0 standard drink of alcohol per week. He reports that he does not use drugs. family history includes Cancer in his paternal grandfather; Cancer (age of onset: 53) in his father; Diabetes in his maternal grandfather, paternal grandmother, and another family member; Hypertension in his father and mother. No Known Allergies  Review of Systems  Constitutional:  Negative for malaise/fatigue.  Eyes:  Negative for blurred vision.  Respiratory:  Negative for shortness of breath.   Cardiovascular:  Negative for chest pain.  Neurological:  Negative for dizziness, weakness and headaches.      Objective:     BP 132/80 (BP Location: Left Arm, Patient Position: Sitting, Cuff Size: Large)   Pulse (!) 54   Temp 97.8 F (36.6 C) (Oral)   Wt 194 lb 3.2 oz (88.1 kg)   SpO2 97%   BMI 29.53 kg/m  BP Readings from Last 3 Encounters:  08/05/23 132/80  04/08/23 135/81  02/04/23 120/80   Wt Readings from Last  3 Encounters:  08/05/23 194 lb 3.2 oz (88.1 kg)  02/04/23 212 lb 4.8 oz (96.3 kg)  02/13/21 203 lb (92.1 kg)      Physical Exam Vitals reviewed.  Constitutional:      Appearance: He is well-developed.  Eyes:     Pupils: Pupils are equal, round, and reactive to light.  Neck:     Thyroid: No thyromegaly.  Cardiovascular:     Rate and Rhythm: Normal rate and regular rhythm.  Pulmonary:     Effort: Pulmonary effort is normal. No respiratory distress.     Breath sounds: Normal breath sounds. No wheezing or rales.  Musculoskeletal:     Cervical back: Neck supple.  Neurological:     Mental Status: He is alert and oriented to person, place, and time.      Results for orders placed or performed in visit on 08/05/23  POC HgB A1c  Result Value Ref Range   Hemoglobin A1C 5.8 (A) 4.0 - 5.6 %   HbA1c POC (<> result, manual entry)     HbA1c, POC (prediabetic range)     HbA1c, POC (controlled diabetic range)        The 10-year ASCVD risk score (Arnett DK, et al., 2019) is: 5.4%    Assessment & Plan:   Problem List Items Addressed This Visit   None Visit Diagnoses       Pre-diabetes    -  Primary   Relevant Orders   POC HgB A1c (Completed)     A1c improved from 6.3 to 5.8%.  Has made some very positive lifestyle changes with increasing exercise and reducing sugars and starches.  Weight down 18 pounds.  Feels better overall.  Continue positive lifestyle changes.  Recheck A1c at physical in January  No follow-ups on file.    Wolm Scarlet, MD

## 2024-01-15 ENCOUNTER — Telehealth: Payer: Self-pay | Admitting: Family Medicine

## 2024-01-15 NOTE — Telephone Encounter (Signed)
 Copied from CRM [id number]. Topic: Clinical - Medication Refill >> Jan 15, 2024  2:50 PM China J wrote: Medication: cialis  (tadalafil ) 10mg   Has the patient contacted their pharmacy? No (Agent: If no, request that the patient contact the pharmacy for the refill. If patient does not wish to contact the pharmacy document the reason why and proceed with request.) (Agent: If yes, when and what did the pharmacy advise?)  This is the patient's preferred pharmacy:  [facility] # [address], [place] - [address] [place] [address] Phone: [phone] Fax: [phone]  Is this the correct pharmacy for this prescription? Yes If no, delete pharmacy and type the correct one.   Has the prescription been filled recently? No  Is the patient out of the medication? Yes  Has the patient been seen for an appointment in the last year OR does the patient have an upcoming appointment? Yes  Can we respond through MyChart? No Please call [phone].  Agent: Please be advised that Rx refills may take up to 3 business days. We ask that you follow-up with your pharmacy.

## 2024-01-16 MED ORDER — TADALAFIL 20 MG PO TABS: 10.0000 mg | ORAL_TABLET | ORAL | 11 refills | Status: DC | PRN

## 2024-01-17 ENCOUNTER — Telehealth: Payer: Self-pay

## 2024-01-17 MED ORDER — TADALAFIL 20 MG PO TABS: 10.0000 mg | ORAL_TABLET | ORAL | 11 refills | Status: AC | PRN

## 2024-01-17 NOTE — Telephone Encounter (Signed)
 Copied from CRM [id number]. Topic: Clinical - Medication Refill >> Jan 15, 2024  2:50 PM China J wrote: Medication: cialis  (tadalafil ) 10mg   Has the patient contacted their pharmacy? No (Agent: If no, request that the patient contact the pharmacy for the refill. If patient does not wish to contact the pharmacy document the reason why and proceed with request.) (Agent: If yes, when and what did the pharmacy advise?)  This is the patient's preferred pharmacy:  [facility] # [address], [place] - [address] [place] [address] Phone: [phone] Fax: [phone]  Is this the correct pharmacy for this prescription? Yes If no, delete pharmacy and type the correct one.   Has the prescription been filled recently? No  Is the patient out of the medication? Yes  Has the patient been seen for an appointment in the last year OR does the patient have an upcoming appointment? Yes  Can we respond through MyChart? No Please call [phone].  Agent: Please be advised that Rx refills may take up to 3 business days. We ask that you follow-up with your pharmacy. >> Jan 17, 2024  4:25 PM Zebedee SAUNDERS wrote: [facility] called pt stated they received the medication. But it must be send to [facility] # [address] [place] - [address] [place] [address] Phone: [phone] Fax: [phone]

## 2024-01-17 NOTE — Telephone Encounter (Signed)
 Rx sent
